# Patient Record
Sex: Male | Born: 1950 | Race: Black or African American | Hispanic: No | Smoking: Never smoker
Health system: Southern US, Community
[De-identification: ages and names within clinical notes are randomized; demographics above are authoritative.]

## PROBLEM LIST (undated history)

## (undated) DIAGNOSIS — N182 Chronic kidney disease, stage 2 (mild): Secondary | ICD-10-CM

## (undated) DIAGNOSIS — I10 Essential (primary) hypertension: Secondary | ICD-10-CM

---

## 2001-06-30 ENCOUNTER — Emergency Department (HOSPITAL_COMMUNITY): Admission: EM | Admit: 2001-06-30 | Discharge: 2001-07-01 | Payer: Self-pay | Admitting: Emergency Medicine

## 2001-07-01 ENCOUNTER — Encounter: Payer: Self-pay | Admitting: Internal Medicine

## 2001-07-06 ENCOUNTER — Encounter: Admission: RE | Admit: 2001-07-06 | Discharge: 2001-07-06 | Payer: Self-pay | Admitting: Internal Medicine

## 2001-07-08 ENCOUNTER — Encounter: Payer: Self-pay | Admitting: Internal Medicine

## 2001-07-08 ENCOUNTER — Ambulatory Visit (HOSPITAL_COMMUNITY): Admission: RE | Admit: 2001-07-08 | Discharge: 2001-07-08 | Payer: Self-pay | Admitting: Internal Medicine

## 2001-08-03 ENCOUNTER — Ambulatory Visit (HOSPITAL_COMMUNITY): Admission: RE | Admit: 2001-08-03 | Discharge: 2001-08-03 | Payer: Self-pay | Admitting: Internal Medicine

## 2001-08-03 ENCOUNTER — Encounter: Payer: Self-pay | Admitting: Internal Medicine

## 2001-09-09 ENCOUNTER — Emergency Department (HOSPITAL_COMMUNITY): Admission: EM | Admit: 2001-09-09 | Discharge: 2001-09-09 | Payer: Self-pay | Admitting: Emergency Medicine

## 2001-09-09 ENCOUNTER — Encounter: Payer: Self-pay | Admitting: Emergency Medicine

## 2001-12-26 ENCOUNTER — Encounter: Payer: Self-pay | Admitting: Emergency Medicine

## 2001-12-26 ENCOUNTER — Emergency Department (HOSPITAL_COMMUNITY): Admission: EM | Admit: 2001-12-26 | Discharge: 2001-12-26 | Payer: Self-pay | Admitting: Emergency Medicine

## 2002-03-31 ENCOUNTER — Encounter: Payer: Self-pay | Admitting: Emergency Medicine

## 2002-04-01 ENCOUNTER — Inpatient Hospital Stay (HOSPITAL_COMMUNITY): Admission: EM | Admit: 2002-04-01 | Discharge: 2002-05-11 | Payer: Self-pay | Admitting: Emergency Medicine

## 2002-04-01 ENCOUNTER — Encounter: Payer: Self-pay | Admitting: Family Medicine

## 2002-04-03 ENCOUNTER — Encounter: Payer: Self-pay | Admitting: Cardiology

## 2002-04-04 ENCOUNTER — Encounter: Payer: Self-pay | Admitting: Family Medicine

## 2002-04-06 ENCOUNTER — Encounter: Payer: Self-pay | Admitting: Family Medicine

## 2002-04-06 ENCOUNTER — Encounter: Payer: Self-pay | Admitting: Internal Medicine

## 2002-04-15 ENCOUNTER — Encounter: Payer: Self-pay | Admitting: Family Medicine

## 2002-04-17 ENCOUNTER — Encounter: Payer: Self-pay | Admitting: Family Medicine

## 2002-04-19 ENCOUNTER — Encounter: Payer: Self-pay | Admitting: Family Medicine

## 2002-04-22 ENCOUNTER — Encounter: Payer: Self-pay | Admitting: Family Medicine

## 2002-04-26 ENCOUNTER — Encounter: Payer: Self-pay | Admitting: Family Medicine

## 2002-04-29 ENCOUNTER — Encounter: Payer: Self-pay | Admitting: Family Medicine

## 2002-05-04 ENCOUNTER — Encounter: Payer: Self-pay | Admitting: Family Medicine

## 2002-05-05 ENCOUNTER — Encounter: Payer: Self-pay | Admitting: Family Medicine

## 2002-05-11 ENCOUNTER — Encounter: Payer: Self-pay | Admitting: Family Medicine

## 2002-05-23 ENCOUNTER — Encounter: Admission: RE | Admit: 2002-05-23 | Discharge: 2002-05-23 | Payer: Self-pay | Admitting: Family Medicine

## 2011-06-25 ENCOUNTER — Inpatient Hospital Stay (HOSPITAL_COMMUNITY)
Admission: EM | Admit: 2011-06-25 | Discharge: 2011-06-27 | DRG: 897 | Disposition: A | Discharge status: Home / Self Care | Payer: Self-pay | Attending: Internal Medicine | Admitting: Internal Medicine

## 2011-06-25 ENCOUNTER — Emergency Department (HOSPITAL_COMMUNITY): Payer: Self-pay

## 2011-06-25 DIAGNOSIS — R55 Syncope and collapse: Secondary | ICD-10-CM | POA: Diagnosis present

## 2011-06-25 DIAGNOSIS — F1010 Alcohol abuse, uncomplicated: Principal | ICD-10-CM | POA: Diagnosis present

## 2011-06-25 DIAGNOSIS — R569 Unspecified convulsions: Secondary | ICD-10-CM | POA: Diagnosis present

## 2011-06-25 DIAGNOSIS — F29 Unspecified psychosis not due to a substance or known physiological condition: Secondary | ICD-10-CM | POA: Diagnosis present

## 2011-06-25 DIAGNOSIS — D649 Anemia, unspecified: Secondary | ICD-10-CM | POA: Diagnosis present

## 2011-06-25 DIAGNOSIS — R030 Elevated blood-pressure reading, without diagnosis of hypertension: Secondary | ICD-10-CM | POA: Diagnosis not present

## 2011-06-25 DIAGNOSIS — E119 Type 2 diabetes mellitus without complications: Secondary | ICD-10-CM | POA: Diagnosis present

## 2011-06-25 LAB — URINALYSIS, ROUTINE W REFLEX MICROSCOPIC
Glucose, UA: 250 mg/dL — AB
Ketones, ur: NEGATIVE mg/dL
Nitrite: NEGATIVE
Specific Gravity, Urine: 1.009 (ref 1.005–1.030)
pH: 5.5 (ref 5.0–8.0)

## 2011-06-25 LAB — COMPREHENSIVE METABOLIC PANEL
ALT: 24 U/L (ref 0–53)
Albumin: 3.2 g/dL — ABNORMAL LOW (ref 3.5–5.2)
Alkaline Phosphatase: 125 U/L — ABNORMAL HIGH (ref 39–117)
Calcium: 8.2 mg/dL — ABNORMAL LOW (ref 8.4–10.5)
GFR calc Af Amer: >60 mL/min (ref >60)
Potassium: 3.5 mEq/L (ref 3.5–5.1)
Sodium: 135 mEq/L (ref 135–145)
Total Protein: 7.2 g/dL (ref 6.0–8.3)

## 2011-06-25 LAB — CK TOTAL AND CKMB (NOT AT ARMC)
CK, MB: 4.9 ng/mL — ABNORMAL HIGH (ref 0.3–4.0)
Total CK: 455 U/L — ABNORMAL HIGH (ref 7–232)

## 2011-06-25 LAB — DIFFERENTIAL
Basophils Absolute: 0 10*3/uL (ref 0.0–0.1)
Basophils Relative: 0 % (ref 0–1)
Eosinophils Absolute: 0.1 10*3/uL (ref 0.0–0.7)
Eosinophils Relative: 2 % (ref 0–5)
Monocytes Absolute: 0.4 10*3/uL (ref 0.1–1.0)
Monocytes Relative: 8 % (ref 3–12)
Neutro Abs: 2.9 10*3/uL (ref 1.7–7.7)

## 2011-06-25 LAB — CARDIAC PANEL(CRET KIN+CKTOT+MB+TROPI)
CK, MB: 4.4 ng/mL — ABNORMAL HIGH (ref 0.3–4.0)
Relative Index: 1.1 (ref 0.0–2.5)
Total CK: 399 U/L — ABNORMAL HIGH (ref 7–232)
Troponin I: <0.3 ng/mL (ref <0.30)

## 2011-06-25 LAB — CBC
MCH: 28.3 pg (ref 26.0–34.0)
MCHC: 32.7 g/dL (ref 30.0–36.0)
Platelets: 183 10*3/uL (ref 150–400)
RDW: 12.4 % (ref 11.5–15.5)

## 2011-06-25 LAB — POCT I-STAT TROPONIN I: Troponin i, poc: 0.01 ng/mL (ref 0.00–0.08)

## 2011-06-25 LAB — POCT I-STAT, CHEM 8
Calcium, Ion: 1.09 mmol/L — ABNORMAL LOW (ref 1.12–1.32)
Chloride: 104 mEq/L (ref 96–112)
HCT: 36 % — ABNORMAL LOW (ref 39.0–52.0)
Hemoglobin: 12.2 g/dL — ABNORMAL LOW (ref 13.0–17.0)
TCO2: 19 mmol/L (ref 0–100)

## 2011-06-25 LAB — RAPID URINE DRUG SCREEN, HOSP PERFORMED
Barbiturates: NOT DETECTED
Benzodiazepines: NOT DETECTED
Cocaine: NOT DETECTED

## 2011-06-25 LAB — PROTIME-INR
INR: 1.13 (ref 0.00–1.49)
Prothrombin Time: 14.7 seconds (ref 11.6–15.2)

## 2011-06-25 LAB — AMMONIA: Ammonia: 30 umol/L (ref 11–60)

## 2011-06-25 LAB — APTT: aPTT: 28 seconds (ref 24–37)

## 2011-06-26 DIAGNOSIS — I509 Heart failure, unspecified: Secondary | ICD-10-CM

## 2011-06-26 DIAGNOSIS — R0989 Other specified symptoms and signs involving the circulatory and respiratory systems: Secondary | ICD-10-CM

## 2011-06-26 LAB — LIPID PANEL
HDL: 29 mg/dL — ABNORMAL LOW (ref >39)
LDL Cholesterol: 64 mg/dL (ref 0–99)
Total CHOL/HDL Ratio: 3.6 RATIO
Triglycerides: 59 mg/dL (ref <150)
VLDL: 12 mg/dL (ref 0–40)

## 2011-06-26 LAB — PRO B NATRIURETIC PEPTIDE: Pro B Natriuretic peptide (BNP): 146.8 pg/mL — ABNORMAL HIGH (ref 0–125)

## 2011-06-26 LAB — CARDIAC PANEL(CRET KIN+CKTOT+MB+TROPI): Troponin I: <0.3 ng/mL (ref <0.30)

## 2011-06-26 LAB — GLUCOSE, CAPILLARY: Glucose-Capillary: 214 mg/dL — ABNORMAL HIGH (ref 70–99)

## 2011-06-26 LAB — HEMOGLOBIN A1C
Hgb A1c MFr Bld: 6.8 % — ABNORMAL HIGH (ref <5.7)
Hgb A1c MFr Bld: 6.8 % — ABNORMAL HIGH (ref <5.7)
Mean Plasma Glucose: 148 mg/dL — ABNORMAL HIGH (ref <117)
Mean Plasma Glucose: 148 mg/dL — ABNORMAL HIGH (ref <117)

## 2011-06-27 ENCOUNTER — Inpatient Hospital Stay (HOSPITAL_COMMUNITY)
Admit: 2011-06-27 | Discharge: 2011-06-27 | Disposition: A | Discharge status: Home / Self Care | Payer: 59 | Attending: Internal Medicine | Admitting: Internal Medicine

## 2011-06-27 LAB — CBC
MCHC: 32.7 g/dL (ref 30.0–36.0)
MCV: 87 fL (ref 78.0–100.0)
Platelets: 202 10*3/uL (ref 150–400)
RDW: 12.5 % (ref 11.5–15.5)
WBC: 4.8 10*3/uL (ref 4.0–10.5)

## 2011-06-27 LAB — COMPREHENSIVE METABOLIC PANEL
AST: 17 U/L (ref 0–37)
Albumin: 2.7 g/dL — ABNORMAL LOW (ref 3.5–5.2)
BUN: 15 mg/dL (ref 6–23)
Chloride: 105 mEq/L (ref 96–112)
Creatinine, Ser: 1.11 mg/dL (ref 0.50–1.35)
Total Bilirubin: 0.3 mg/dL (ref 0.3–1.2)
Total Protein: 6.3 g/dL (ref 6.0–8.3)

## 2011-06-27 LAB — DIFFERENTIAL
Basophils Absolute: 0 10*3/uL (ref 0.0–0.1)
Eosinophils Absolute: 0.1 10*3/uL (ref 0.0–0.7)
Eosinophils Relative: 3 % (ref 0–5)
Lymphs Abs: 1.7 10*3/uL (ref 0.7–4.0)
Monocytes Absolute: 0.6 10*3/uL (ref 0.1–1.0)

## 2011-06-27 LAB — MAGNESIUM: Magnesium: 1.6 mg/dL (ref 1.5–2.5)

## 2011-06-27 NOTE — Procedures (Unsigned)
REFERRING PHYSICIAN:  Dr. Beverly Gust.  HISTORY:  A 60 year old diabetic was found intoxicated and confused by the law and enforcement and brought to the hospital.  EEG for evaluation.  MEDICATIONS:  Lovenox, aspirin, and Ativan.  EEG DURATION:  22.5 minutes of EEG recording.  EEG DESCRIPTION:  This is a routine 16 channel adult EEG recording with one channel devoted to limited EKG recording.  Activation procedure is performed using the photic stimulation.  The study is performed in awake and mild drowsy state. As the EEG opens up, I noticed that the most prominent finding throughout the study is technical artifact affecting the posterior leads as well as the EKG artifact.  Making it difficult to isolate clear location for a posterior dominant rhythm.  However, seems like the background rhythm is in 8 Hz frequency with a low amplitude of 16 Hz at the most.  On this technically limited quality study.  I do not see any electrographic seizures or epileptiform discharges however.  No buildup of high amplitude slowing was noted with the hyperventilation likely because of the lack of efforts.  I do observe some vertex waves, as the patient got drowsy during the study. However, no deep stages of sleep are identified during the study.  Superimposed beta activity is also seen throughout the study pretty much.  EEG INTERPRETATION:  This is a limited quality study technically but does not have any electrographic seizures or epileptiform discharges. Most likely, it is a normal awake and sleep study.  Superimposed beta activities commonly seen in the patients who are anxious or are in sedating medications.  There is no obvious asymmetry or abnormality otherwise detected on the study.          ______________________________ Levie Heritage, MD    [id number] D:  06/27/2011 13:50:39  T:  06/27/2011 23:19:23  Job #:  [id number]

## 2011-07-01 LAB — CULTURE, BLOOD (ROUTINE X 2)
Culture  Setup Time: 201209052357
Culture: NO GROWTH

## 2011-07-08 NOTE — Discharge Summary (Signed)
[name], [name] NO.:  [id number]  MEDICAL RECORD NO.:  [id number]  LOCATION:  1429                         FACILITY:  [facility]  PHYSICIAN:  Marcellus Scott, MD     DATE OF BIRTH:  [birth year]  DATE OF ADMISSION:  06/25/2011 DATE OF DISCHARGE:                        DISCHARGE SUMMARY - REFERRING   PRIMARY CARE PHYSICIAN:  The patient is to be established with [facility].  Followup appointment made this admission.  DATE OF DISPOSITION:  June 27, 2011.  DISCHARGE DIAGNOSES: 1. Acute ethyl alcohol intoxication 2. Syncope with question of seizure at the time of admission. 3. Type 2 diabetes. 4. Anemia, normocytic. 5. Social stressors.  PROCEDURES DURING HOSPITALIZATION: 1. L-spine film obtained June 25, 2011, showed no acute findings. 2. T-spine film obtained June 25, 2011, with no acute findings. 3. Chest x-ray performed June 25, 2011, showing cardiomegaly with     no acute findings. 4. CT of the head performed June 25, 2011, showed no acute     intracranial findings. 5. CT of the C-spine performed June 25, 2011, showed no acute     findings. 6. 2D echocardiogram performed June 26, 2011, showing left     ventricular ejection fraction of 55-60% consistent with mild left     ventricular hypertrophy, no wall motion abnormalities were noted. 7. Carotid Doppler studies performed June 26, 2011, with no     evidence of internal carotid artery stenosis bilaterally. 8. EEG performed June 27, 2011, with no evidence of seizure     activity.  BRIEF HISTORY OF PRESENT ILLNESS:  [name] is a 60 year old African American male with past medical history of 60 year old type 2 diabetes who was found down at [facility] in [facility] by Patent examiner.  Apparently, the patient was found to be intoxicated and confused, prompting transport to the emergency department.  There was some question on arrival whether or not the patient experienced a  syncopal episode.  Witnesses did say that the patient did have some jerking movements of both his upper and lower extremities, also causing some concern for seizure activity.  The patient remained quite confused upon evaluation in the emergency department.  Therefore, complete history was very difficult to obtain. At this point, the patient is back to baseline mentation; however, he does not recall exact details of incident.  He denies chronic EtOH abuse; however, reports acute intoxication.  The patient states he was incarcerated for approximately 15 months and released from prison on June 13, 2011.  Since that time, he has been staying at a shelter in [facility]  COURSE OF HOSPITALIZATION: 1. Acute alcoholic intoxication.  The patient did not show any     evidence of withdrawal during hospitalization.  He was started on     CIWA protocol at the time of admission.  Again, the patient     continued to deny heavy EtOH abuse, but does admit to acute     intoxication.  The patient's alcohol level on initial evaluation     was 293.  The patient has been counseled extensively on the risk of     heavy alcohol abuse.  The patient has verified understanding.  The  patient was administered multivitamin during hospitalization.  He     was asked to continue over-the-counter multivitamin at the time of     discharge. 2. Syncope.  There was a question of seizure activity at the time of     admission.  The patient underwent full workup for both syncope and     seizure.  However, the patient's symptoms felt likely related to     acute alcohol intoxication.  The patient underwent 2D     echocardiogram and carotid studies with results as listed above.     The patient did not have any arrhythmias on telemetry.  During     hospitalization, his heart rate remained stable in the 50s and 60s.     He did drop in the 40s one time, however, the patient was     asymptomatic.  The patient's EKG  did not show any evidence of QT     prolongation or any ST elevation. 3. Type 2 diabetes.  The patient states he was on medication for his     diabetes.  However, he had lost prescriptions and is uncertain of     what medication he was on.  The patient has been counseled on     diabetic diet.  Given that the patient's hemoglobin A1c was 6.9%,     we will start the patient on metformin b.i.d. and have the patient     followup with [facility] as listed below. 4. Anemia, normocytic.  The patient's lab work revealed some mild     normocytic anemia with hemoglobin of 10.  The patient's hemoglobin     on day of disposition 10.1, slight drop likely due to dilutional     component with IV fluids.  We will ask that anemia be worked up as     an outpatient.  Again, followup scheduled as listed below. 5. Social issues.  As mentioned above, the patient recently discharged     from state prison after serving 15 months in it.  Prior to this     hospitalization, he was staying at shelter and heard that space was     unavailable due to hospitalization.  Social worker was asked to     consult.  The patient is given referrals for shelter resources as     well as followup at [facility].  The patient also given     information about ED [facility] for continued medical     care.  DISCHARGE MEDICATIONS: 1. Multivitamin 1 p.o. daily. 2. Metformin 500 mg p.o. b.i.d.  PERTINENT LABORATORY FINDINGS:  White cell count 4.8, platelet count 202, hemoglobin 10.1, and hematocrit 30.9.  Sodium 136, potassium 3.8, BUN 15, and creatinine 1.11.  LFTs within normal limits.  Albumin 2.7 and magnesium 1.6.  Hemoglobin A1c 6.8%.  Total cholesterol 105, triglycerides 59, HDL 29, and LDL 64.  TSH 1.181.  DISPOSITION:  Again, the patient felt medically stable for discharge at this time.  EEG results have been discussed with Dr. Hoy Morn with no evidence of seizure activity.  The patient is  scheduled for followup with Dr. Kathlene Cote at [facility] on July 21, 2011, at 10:30 a.m.  The patient is also instructed on diabetic diet at the time of discharge.  The patient verifies understanding of discharge instructions. He indicates that he will be staying  with his nephew on discharge from hospital.  Approximately 30 minutes spent on discharge planning.     Cordelia Pen,  NP   ______________________________ Marcellus Scott, MD    LE/MEDQ  D:  06/27/2011  T:  06/27/2011  Job:  [id number]  cc:   Andria Frames Fax: [phone]  Electronically Signed by Marcellus Scott MD on 07/08/2011 06:07:04 PM

## 2011-07-28 NOTE — H&P (Signed)
NAMEDARRIK, RICHMAN NO.:  [id number]  MEDICAL RECORD NO.:  [id number]  LOCATION:  WLED                         FACILITY:  [facility]  PHYSICIAN:  Richarda Overlie, MD       DATE OF BIRTH:  [birth year]  DATE OF ADMISSION:  06/25/2011 DATE OF DISCHARGE:                             HISTORY & PHYSICAL   PRIMARY CARE PHYSICIAN:  Unassigned.  CHIEF COMPLAINT:  Syncope and collapse.  SUBJECTIVE:  This is a 60 year old male with a history of diabetes and no other past medical history, who was [place] at the park in [place] by Patent examiner agency when he was found to be intoxicated, confused, and possibly postictal.  The patient's syncopal episode and fall to the ground was unwitnessed, but apparently, the patient had jerking movements of both his upper and lower extremities.  There is no obvious witness at this point for me to verify this information.  However, the patient was apparently confused when the EMS found him.  He is still confused and is unable to provide any meaningful history and most of the history is compiled from the ER notes.  The patient currently denies any chest pain, any shortness of breath, nausea, vomiting, abdominal pain, headache, blurry vision, slurred speech, unilateral weakness, or dysarthria.  There is no prior history of seizures.  The patient denies any fever, chills, rigors, cough, any urinary urgency, frequency, or dysuria.  PAST MEDICAL HISTORY:  History of diabetes.  PAST SURGICAL HISTORY:  Unable to obtain.  FAMILY HISTORY:  Again unable to obtain "the patient states that all his family is deceased."  SOCIAL HISTORY:  The patient cannot recall where he lives and states that he is homeless, but this information cannot be verified at this point.  He smokes a pack a day.  He states that he is a heavy drinker and drinks 80 ounces of beer on a daily basis.  ALLERGIES:  Again unable to stand for his allergies.  HOME MEDICATIONS:  The  patient does not recall what his home medications.  PHYSICAL EXAMINATION:  VITAL SIGNS:  Blood pressure 131/80, pulse of 83, temperature of 97.9, and pulse 83. GENERAL:  Comfortable currently, no acute cardiopulmonary distress, but confused and disoriented to time, place, and person. NECK:  Supple.  No JVD. LUNGS:  Clear to auscultation bilaterally.  No wheezes, no crackles or rhonchi. CARDIOVASCULAR:  Regular rate and rhythm.  No murmurs, rubs, or gallops. ABDOMEN:  Obese, soft, nontender, and nondistended. EXTREMITIES:  Without cyanosis, clubbing, or edema. NEUROLOGIC:  Cranial nerves II through XII appear to be grossly intact without any gross focal neurologic deficits.  Motor strength intact in bilateral upper and lower extremities.  No asterixis. PSYCHIATRIC:  Appropriate mood and affect, but confused and disoriented to time, place, and person. LYMPHATIC:  No axillary, inguinal, or cervical lymphadenopathy.  CT scan of the C-spine shows no acute intracranial abnormality or spondylosis.  No acute findings.  CT scan of the head without contrast shows no acute intracranial abnormality.  Chest x-ray shows cardiomegaly with mild basilar atelectasis.  X-ray of the T-spine shows no acute abnormality.  X-ray of the L-spine shows no acute abnormality.  Urinalysis shows small amount of leukocyte esterase.  Urine drug screen is negative.  INR is 1.13.  Lipase is 5.  EtOH level is 293.  Sodium 135, potassium 3.5, chloride 104, bicarbonate 19, glucose 202, BUN 14, creatinine 1.28, AST 29, ALT 24, calcium 8.2, and alkaline phosphatase 125.  CBC, WBC 4.9, hemoglobin 10.4, hematocrit 31.8, and platelet count of 183.  Troponin 0.01.  ASSESSMENT: 1. Alcohol intoxication. 2. Questionable history of seizure versus syncopal episode secondary     to intoxication because for alcohol. 3. Possible transient ischemic attack in the setting of diabetes. 4. Type 2 diabetes, uncontrolled.  PLAN:  The  patient will be admitted to Telemetry because of a syncopal episode.  No intracranial abnormalities found.  The patient does not have any gross focal neurologic symptoms.  However, being a diabetic, TIA cannot be ruled out.  We will hydrate him with IV fluids.  Monitor him for any signs of alcohol withdrawal.  Check an ammonia level, start him on CIWA protocol.  Check him for cardiovascular risk factors including hemoglobin A1c and fasting lipid profile.  We will maintain him on telemetry and cycle his cardiac enzymes.  We will follow serial EKGs and do a 2-D echo to rule out any wall motion abnormalities.  We will also do a carotid Doppler to rule out stenosis.  However, I feel that the primary problem is not a TIA, but it is alcohol intoxication and syncope in the setting of an alcohol level of 293.  He is a full code.     Richarda Overlie, MD     NA/MEDQ  D:  06/25/2011  T:  06/25/2011  Job:  [id number]  Electronically Signed by Richarda Overlie MD on 07/28/2011 02:00:19 PM

## 2011-10-06 ENCOUNTER — Emergency Department (HOSPITAL_COMMUNITY)
Admission: EM | Admit: 2011-10-06 | Discharge: 2011-10-07 | Disposition: A | Discharge status: Home / Self Care | Payer: Self-pay | Attending: Emergency Medicine | Admitting: Emergency Medicine

## 2011-10-06 ENCOUNTER — Emergency Department (HOSPITAL_COMMUNITY)
Admission: EM | Admit: 2011-10-06 | Discharge: 2011-10-06 | Disposition: A | Discharge status: Home / Self Care | Payer: Self-pay

## 2011-10-06 DIAGNOSIS — R739 Hyperglycemia, unspecified: Secondary | ICD-10-CM

## 2011-10-06 DIAGNOSIS — R079 Chest pain, unspecified: Secondary | ICD-10-CM | POA: Insufficient documentation

## 2011-10-06 DIAGNOSIS — E119 Type 2 diabetes mellitus without complications: Secondary | ICD-10-CM | POA: Insufficient documentation

## 2011-10-06 DIAGNOSIS — R059 Cough, unspecified: Secondary | ICD-10-CM | POA: Insufficient documentation

## 2011-10-06 DIAGNOSIS — F10929 Alcohol use, unspecified with intoxication, unspecified: Secondary | ICD-10-CM

## 2011-10-06 DIAGNOSIS — F1010 Alcohol abuse, uncomplicated: Secondary | ICD-10-CM | POA: Insufficient documentation

## 2011-10-06 DIAGNOSIS — Z794 Long term (current) use of insulin: Secondary | ICD-10-CM | POA: Insufficient documentation

## 2011-10-06 DIAGNOSIS — R05 Cough: Secondary | ICD-10-CM | POA: Insufficient documentation

## 2011-10-06 DIAGNOSIS — R0602 Shortness of breath: Secondary | ICD-10-CM | POA: Insufficient documentation

## 2011-10-07 ENCOUNTER — Encounter: Payer: Self-pay | Admitting: Emergency Medicine

## 2011-10-07 ENCOUNTER — Other Ambulatory Visit: Payer: Self-pay

## 2011-10-07 ENCOUNTER — Emergency Department (HOSPITAL_COMMUNITY): Payer: Self-pay

## 2011-10-07 LAB — URINE MICROSCOPIC-ADD ON

## 2011-10-07 LAB — URINALYSIS, ROUTINE W REFLEX MICROSCOPIC
Bilirubin Urine: NEGATIVE
Hgb urine dipstick: NEGATIVE
Nitrite: NEGATIVE
Protein, ur: NEGATIVE mg/dL
Specific Gravity, Urine: 1.027 (ref 1.005–1.030)
Urobilinogen, UA: 0.2 mg/dL (ref 0.0–1.0)

## 2011-10-07 LAB — COMPREHENSIVE METABOLIC PANEL
AST: 22 U/L (ref 0–37)
BUN: 13 mg/dL (ref 6–23)
CO2: 23 mEq/L (ref 19–32)
Calcium: 9.5 mg/dL (ref 8.4–10.5)
Chloride: 101 mEq/L (ref 96–112)
Creatinine, Ser: 1.07 mg/dL (ref 0.50–1.35)
GFR calc Af Amer: 87 mL/min — ABNORMAL LOW (ref >90)
GFR calc non Af Amer: 75 mL/min — ABNORMAL LOW (ref >90)
Glucose, Bld: 284 mg/dL — ABNORMAL HIGH (ref 70–99)
Total Bilirubin: 0.2 mg/dL — ABNORMAL LOW (ref 0.3–1.2)

## 2011-10-07 LAB — CBC
HCT: 40.5 % (ref 39.0–52.0)
Hemoglobin: 13.6 g/dL (ref 13.0–17.0)
MCV: 87.7 fL (ref 78.0–100.0)
RBC: 4.62 MIL/uL (ref 4.22–5.81)
RDW: 12.5 % (ref 11.5–15.5)
WBC: 6.3 10*3/uL (ref 4.0–10.5)

## 2011-10-07 LAB — DIFFERENTIAL
Basophils Absolute: 0 10*3/uL (ref 0.0–0.1)
Eosinophils Relative: 1 % (ref 0–5)
Lymphocytes Relative: 25 % (ref 12–46)
Lymphs Abs: 1.6 10*3/uL (ref 0.7–4.0)
Monocytes Absolute: 0.4 10*3/uL (ref 0.1–1.0)
Monocytes Relative: 6 % (ref 3–12)
Neutro Abs: 4.3 10*3/uL (ref 1.7–7.7)

## 2011-10-07 LAB — RAPID URINE DRUG SCREEN, HOSP PERFORMED
Amphetamines: NOT DETECTED
Tetrahydrocannabinol: NOT DETECTED

## 2011-10-07 LAB — ETHANOL: Alcohol, Ethyl (B): 175 mg/dL — ABNORMAL HIGH (ref 0–11)

## 2011-10-07 MED ORDER — METFORMIN HCL 500 MG PO TABS: 500.0000 mg | ORAL_TABLET | Freq: Two times a day (BID) | ORAL | Status: AC

## 2011-10-07 MED ORDER — SODIUM CHLORIDE 0.9 % IV BOLUS (SEPSIS)
1000.0000 mL | Freq: Once | INTRAVENOUS | Status: AC
  Administered 2011-10-07: 1000 mL via INTRAVENOUS

## 2011-10-07 MED ORDER — SODIUM CHLORIDE 0.9 % IV BOLUS (SEPSIS)
500.0000 mL | Freq: Once | INTRAVENOUS | Status: AC
  Administered 2011-10-07: 500 mL via INTRAVENOUS

## 2011-10-07 MED ORDER — SODIUM CHLORIDE 0.9 % IV SOLN
INTRAVENOUS | Status: DC
  Administered 2011-10-07: 10:00:00 via INTRAVENOUS

## 2011-10-07 MED ORDER — HEPATITIS B VAC RECOMBINANT 10 MCG/0.5ML IJ SUSP: 0.5000 mL | Freq: Once | INTRAMUSCULAR | Status: DC

## 2011-10-07 NOTE — ED Notes (Signed)
Vital signs stable. 

## 2011-10-07 NOTE — ED Notes (Signed)
Patient is resting comfortably. 

## 2011-10-07 NOTE — ED Notes (Signed)
Pt. Is not able to use the restroom at this time.  

## 2011-10-07 NOTE — ED Notes (Signed)
Patient denies pain and is resting comfortably.  

## 2011-10-07 NOTE — ED Notes (Addendum)
Pt st's he's had a cough for about 2 weeks.  Pt denies n/v, st's he had some diarrhea yesterday.  Complains of chest pain (only when he coughs) and denies abdominal pain, no SOB.  Lung sounds clear and equal bilaterally.  No fevers.

## 2011-10-07 NOTE — ED Notes (Signed)
Pt states he is here for a cough  Pt states it has been going on for the past two weeks and he wants it checked out

## 2011-10-07 NOTE — ED Notes (Signed)
Pt DC'd to home.  Alert and oriented.  Denies pain.  Cough present however prescription medication provided.  Pt given bus pass to assist with discharge home.  Pt has all belongings.  Vitals stable.  Discharge instructions provided.  Pt verbalized understanding.  No other concerns at this time.  Barrie Lyme 11:31 AM 10/07/2011

## 2011-10-07 NOTE — ED Provider Notes (Addendum)
History     CSN: [id number] Arrival date & time: 10/06/2011 11:22 PM   First MD Initiated Contact with Patient 10/07/11 [phone]      Chief Complaint  Patient presents with  . Cough    (Consider location/radiation/quality/duration/timing/severity/associated sxs/prior treatment) Patient is a 60 y.o. male presenting with cough. The history is provided by the patient.  Cough This is a new problem. The current episode started more than 2 days ago. The problem occurs hourly. The problem has not changed since onset.The cough is non-productive. There has been no fever. Associated symptoms include chest pain (Only with cough) and shortness of breath. Pertinent negatives include no chills, no sweats, no headaches, no myalgias and no wheezing. He has tried nothing for the symptoms. He is not a smoker.   patient states he walks about 5 miles to get here. During the walk. He had to stop several times because he felt tired. He states he stopped his insulin about a week ago because he ran out. He was given to him by Dr. when he was admitted to this hospital recently. He has no primary care doctor.  Past Medical History  Diagnosis Date  . Diabetes mellitus     History reviewed. No pertinent past surgical history.  History reviewed. No pertinent family history.  History  Substance Use Topics  . Smoking status: Never Smoker   . Smokeless tobacco: Not on file  . Alcohol Use: Yes     heavy      Review of Systems  Constitutional: Negative for chills.  Respiratory: Positive for cough and shortness of breath. Negative for wheezing.   Cardiovascular: Positive for chest pain (Only with cough).  Musculoskeletal: Negative for myalgias.  Neurological: Negative for headaches.  All other systems reviewed and are negative.    Allergies  Review of patient's allergies indicates no known allergies.  Home Medications  No current outpatient prescriptions on file.  BP 147/82  Pulse 58  Temp(Src)  97.8 F (36.6 C) (Oral)  Resp 16  SpO2 100%  Physical Exam  Constitutional: He is oriented to person, place, and time. He appears well-developed and well-nourished.  HENT:  Head: Normocephalic and atraumatic.  Eyes: Pupils are equal, round, and reactive to light.  Neck: Normal range of motion. Neck supple.  Cardiovascular: Normal rate and regular rhythm.   Pulmonary/Chest: Effort normal and breath sounds normal.  Abdominal: Soft. Bowel sounds are normal.  Musculoskeletal: Normal range of motion. He exhibits no edema and no tenderness.  Neurological: He is alert and oriented to person, place, and time. No cranial nerve deficit. He exhibits normal muscle tone. Coordination normal.  Skin: Skin is warm and dry.  Psychiatric: He has a normal mood and affect. His behavior is normal. Judgment and thought content normal.    ED Course  Procedures (including critical care time)  Labs Reviewed  COMPREHENSIVE METABOLIC PANEL - Abnormal; Notable for the following:    Glucose, Bld 284 (*)    Total Protein 9.1 (*)    Alkaline Phosphatase 161 (*)    Total Bilirubin 0.2 (*)    GFR calc non Af Amer 75 (*)    GFR calc Af Amer 87 (*)    All other components within normal limits  URINALYSIS, ROUTINE W REFLEX MICROSCOPIC - Abnormal; Notable for the following:    Glucose, UA >1000 (*)    Ketones, ur TRACE (*)    All other components within normal limits  ETHANOL - Abnormal; Notable for the following:  Alcohol, Ethyl (B) 175 (*)    All other components within normal limits  URINE MICROSCOPIC-ADD ON - Abnormal; Notable for the following:    Bacteria, UA FEW (*)    Casts HYALINE CASTS (*) GRANULAR CAST   All other components within normal limits  CBC  DIFFERENTIAL  URINE RAPID DRUG SCREEN (HOSP PERFORMED)   Dg Chest 2 View  10/07/2011  *RADIOLOGY REPORT*  Clinical Data: Cough, mid chest pain.  CHEST - 2 VIEW  Comparison: 06/25/2011  Findings: Heart size upper normal limits.  No focal  consolidation. No pleural effusion or pneumothorax.  No acute osseous abnormality.  IMPRESSION: No acute process identified.  Original Report Authenticated By: Waneta Martins, M.D.   09:27- care to Dr. Preston Fleeting.  No diagnosis found.   Date: 10/07/2011  Rate: 69  Rhythm: normal sinus rhythm  QRS Axis: normal  Intervals: normal  ST/T Wave abnormalities: normal  Conduction Disutrbances:none  Narrative Interpretation: LVH  Old EKG Reviewed: unchanged   MDM  Hypoglycemia, and alcohol intoxication. Is not clear what medications the patient takes. He is too intoxicated to leave at this time. He came here by himself, walking.       Flint Melter, MD 10/07/11 [id number]  Flint Melter, MD 10/07/11 1191  0945: Care assumed from Dr. Sherryl Manges. Patient with alcohol intoxication and history of medication noncompliance. Attempts are being made at this time to do a medication reconciliation so that he can be given new prescriptions for his medications, and he will be observed in the emergency department until he has achieved a level of sobriety which did make him safe to discharge.  10:45: Patient reevaluated. He is awake, alert, oriented and coherent. I reviewed his old records and he was discharged from the hospital on metformin 500 mg twice a day. The patient states his medications have not changed since then. You have be given a prescription for metformin and he is urged to follow up with his physician at [facility] in the next week.  Dione Booze, MD 10/07/11 1044

## 2013-06-22 IMAGING — CR DG THORACIC SPINE 2V
3 series · 3 of 3 positions shown · non-contrast
Comparison: None

CLINICAL DATA: Recent seizure with fall, mid to lower back pain

THORACIC SPINE - 2 VIEW

[t thoracic spine ap]
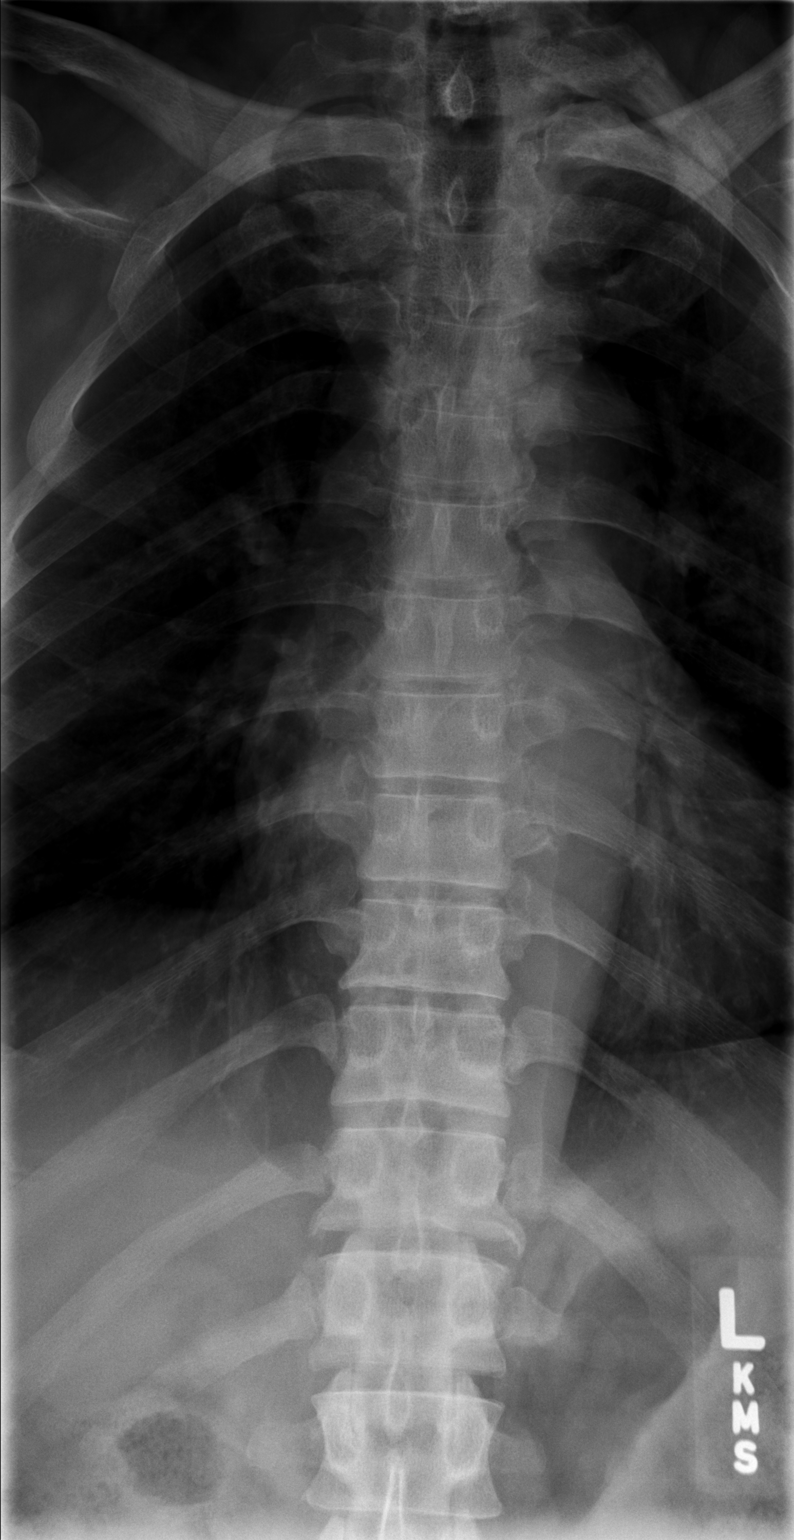

[t thoracic spine lat]
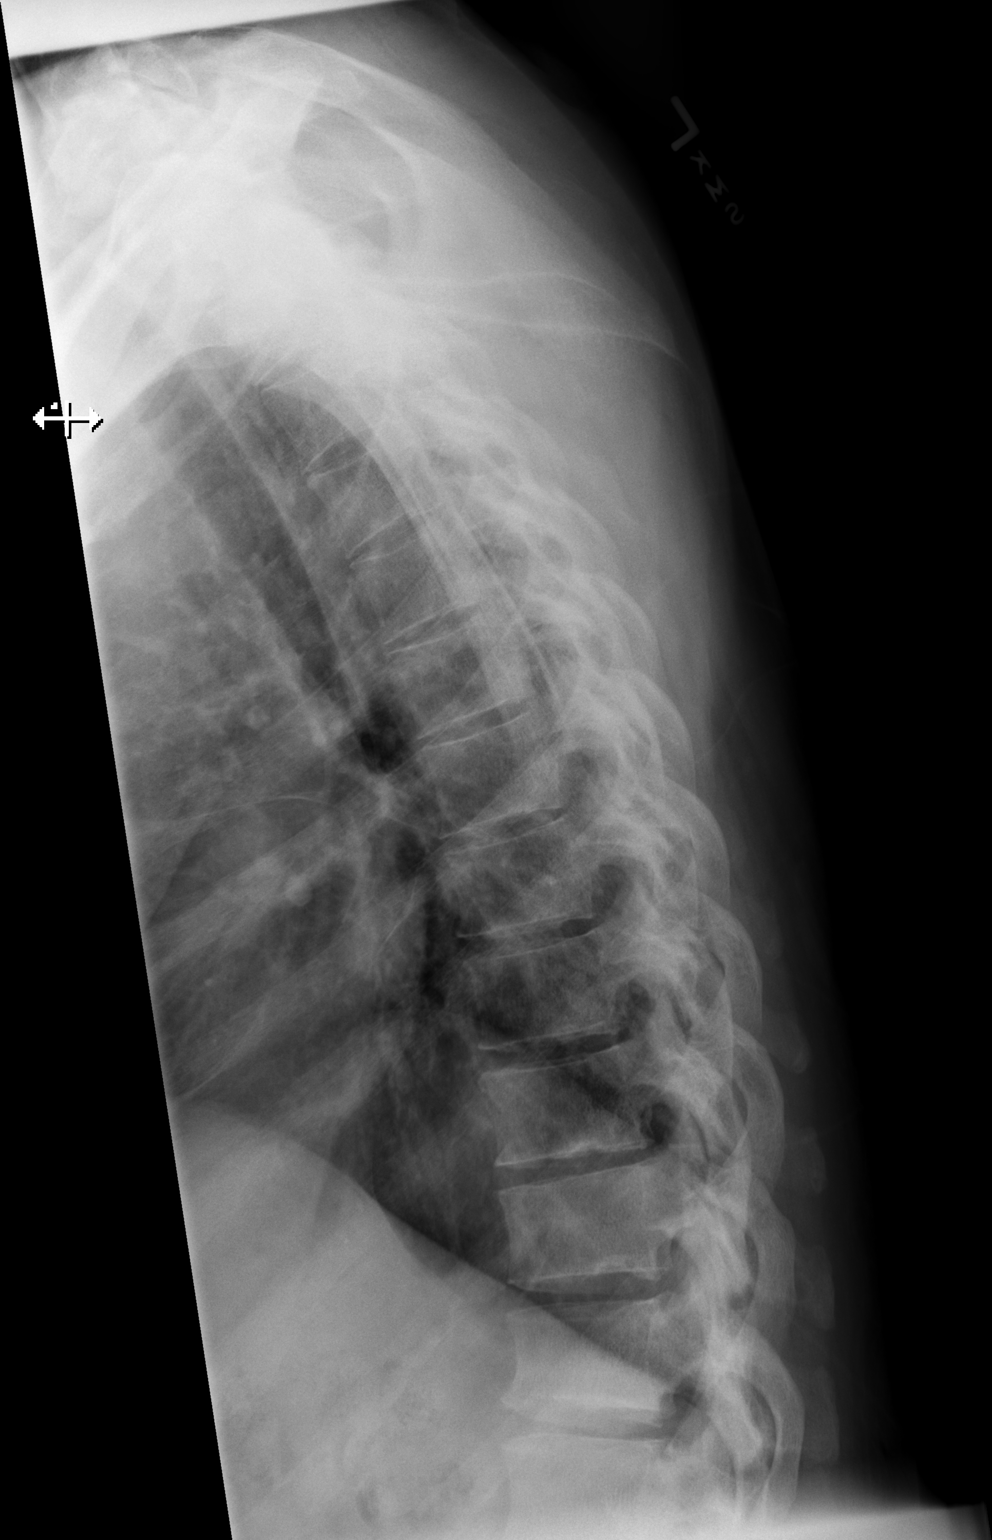

[t thoracic swimmers]
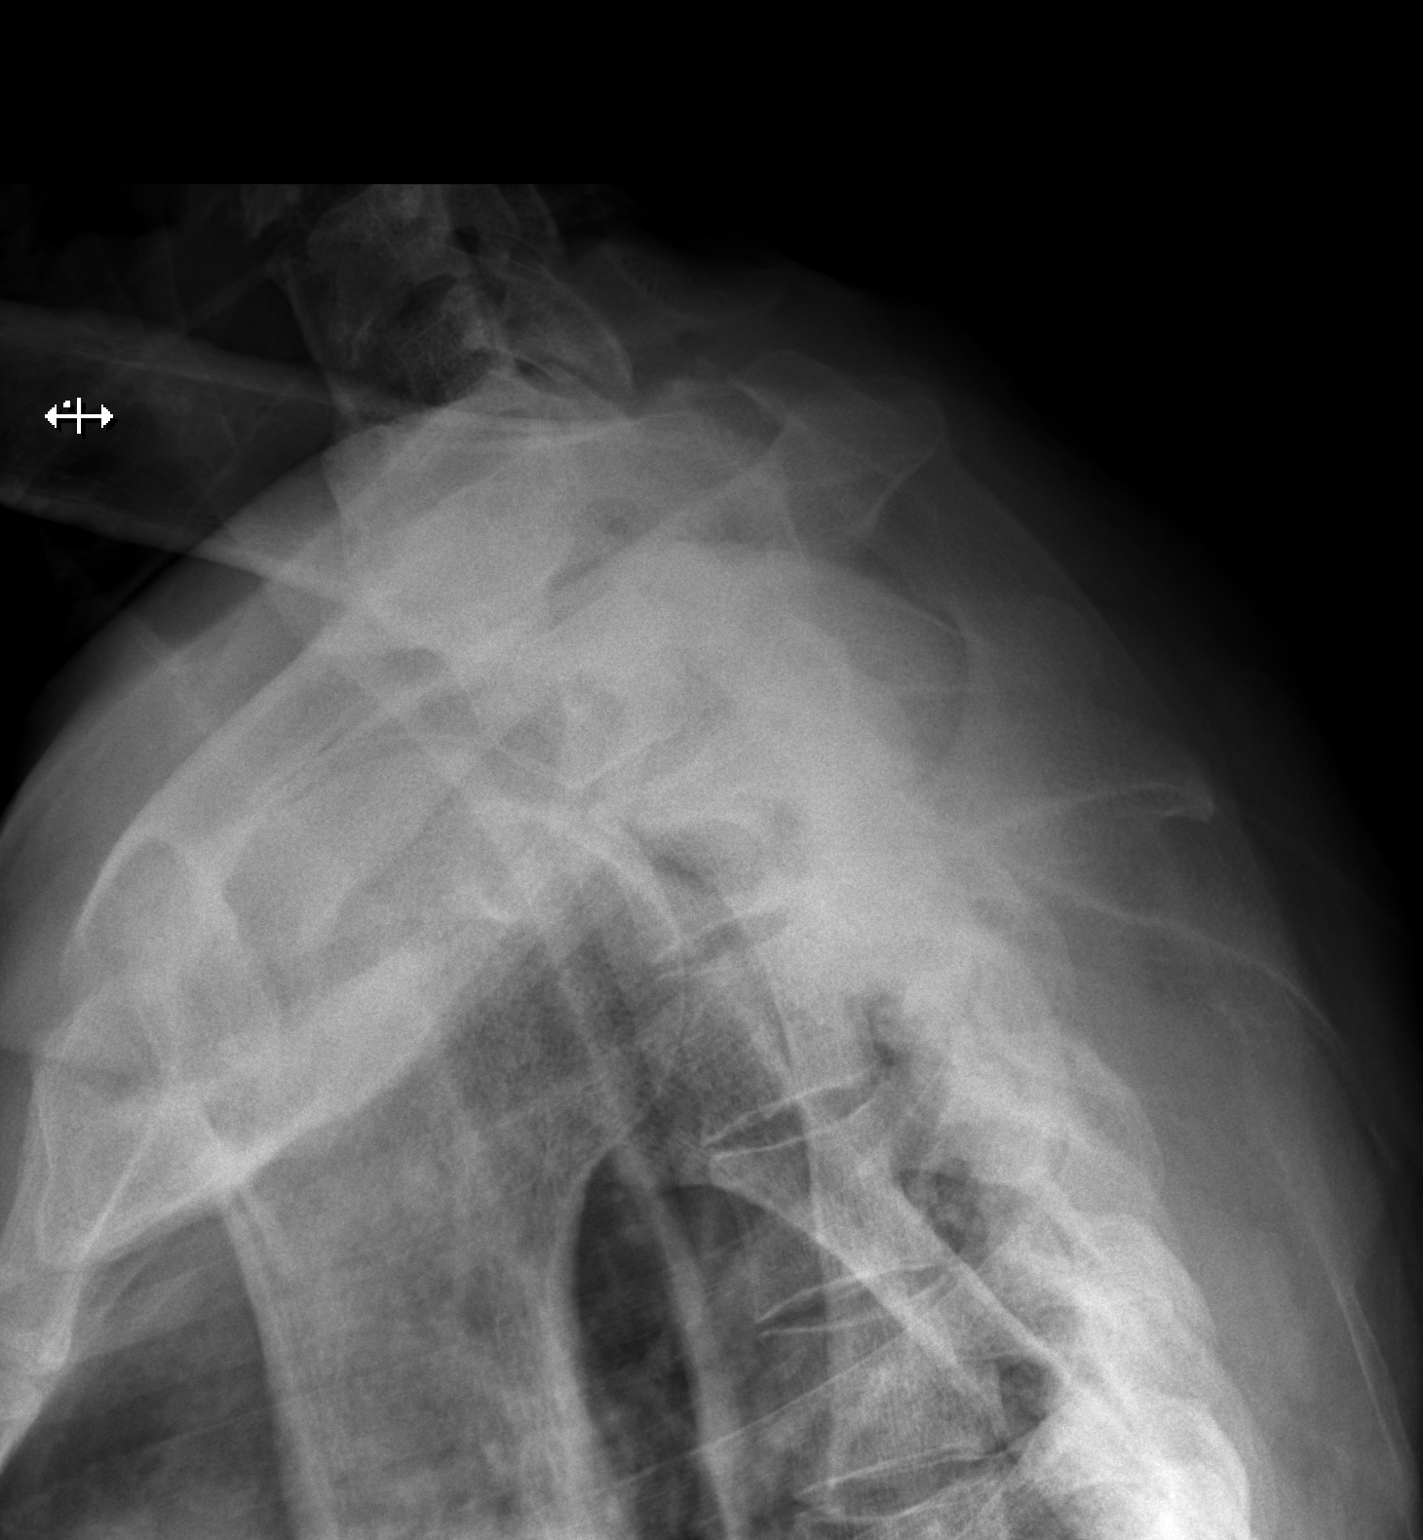

[3 of 3 positions shown; findings below may reference images not displayed]

FINDINGS: The thoracic vertebrae are in normal alignment.  No
compression deformity is seen.  Minimal degenerative change is
present in the lower thoracic spine.
IMPRESSION: No acute abnormality.

## 2017-04-12 ENCOUNTER — Encounter (HOSPITAL_COMMUNITY): Payer: Self-pay | Admitting: Emergency Medicine

## 2017-04-12 ENCOUNTER — Emergency Department (HOSPITAL_COMMUNITY)
Admission: EM | Admit: 2017-04-12 | Discharge: 2017-04-12 | Disposition: A | Discharge status: Home / Self Care | Payer: Self-pay | Attending: Emergency Medicine | Admitting: Emergency Medicine

## 2017-04-12 DIAGNOSIS — M25571 Pain in right ankle and joints of right foot: Secondary | ICD-10-CM

## 2017-04-12 DIAGNOSIS — M25572 Pain in left ankle and joints of left foot: Secondary | ICD-10-CM

## 2017-04-12 DIAGNOSIS — Z7984 Long term (current) use of oral hypoglycemic drugs: Secondary | ICD-10-CM | POA: Insufficient documentation

## 2017-04-12 DIAGNOSIS — E119 Type 2 diabetes mellitus without complications: Secondary | ICD-10-CM | POA: Insufficient documentation

## 2017-04-12 DIAGNOSIS — M79671 Pain in right foot: Secondary | ICD-10-CM | POA: Insufficient documentation

## 2017-04-12 DIAGNOSIS — M79672 Pain in left foot: Secondary | ICD-10-CM | POA: Insufficient documentation

## 2017-04-12 MED ORDER — GABAPENTIN 100 MG PO CAPS: 100.0000 mg | ORAL_CAPSULE | Freq: Three times a day (TID) | ORAL | 0 refills | Status: DC

## 2017-04-12 MED ORDER — ACETAMINOPHEN 325 MG PO TABS
650.0000 mg | ORAL_TABLET | Freq: Once | ORAL | Status: AC
  Administered 2017-04-12: 650 mg via ORAL
  Filled 2017-04-12: qty 2

## 2017-04-12 MED ORDER — ACETAMINOPHEN 500 MG PO TABS
500.0000 mg | ORAL_TABLET | Freq: Four times a day (QID) | ORAL | 0 refills | Status: AC | PRN
Start: 2017-04-12 — End: ?

## 2017-04-12 MED ORDER — GABAPENTIN 100 MG PO CAPS
100.0000 mg | ORAL_CAPSULE | Freq: Once | ORAL | Status: AC
  Administered 2017-04-12: 100 mg via ORAL
  Filled 2017-04-12: qty 1
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
## 2017-04-16 ENCOUNTER — Inpatient Hospital Stay (HOSPITAL_COMMUNITY)
Admission: EM | Admit: 2017-04-16 | Discharge: 2017-04-18 | DRG: 638 | Disposition: A | Discharge status: Home / Self Care | Payer: Self-pay | Attending: Internal Medicine | Admitting: Internal Medicine

## 2017-04-16 ENCOUNTER — Encounter (HOSPITAL_COMMUNITY): Payer: Self-pay

## 2017-04-16 DIAGNOSIS — M79605 Pain in left leg: Secondary | ICD-10-CM

## 2017-04-16 DIAGNOSIS — N179 Acute kidney failure, unspecified: Secondary | ICD-10-CM | POA: Diagnosis present

## 2017-04-16 DIAGNOSIS — E860 Dehydration: Secondary | ICD-10-CM | POA: Diagnosis present

## 2017-04-16 DIAGNOSIS — E1142 Type 2 diabetes mellitus with diabetic polyneuropathy: Secondary | ICD-10-CM | POA: Diagnosis present

## 2017-04-16 DIAGNOSIS — E1110 Type 2 diabetes mellitus with ketoacidosis without coma: Principal | ICD-10-CM | POA: Diagnosis present

## 2017-04-16 DIAGNOSIS — E1122 Type 2 diabetes mellitus with diabetic chronic kidney disease: Secondary | ICD-10-CM | POA: Diagnosis present

## 2017-04-16 DIAGNOSIS — Z5900 Homelessness unspecified: Secondary | ICD-10-CM

## 2017-04-16 DIAGNOSIS — M79604 Pain in right leg: Secondary | ICD-10-CM | POA: Diagnosis present

## 2017-04-16 DIAGNOSIS — N182 Chronic kidney disease, stage 2 (mild): Secondary | ICD-10-CM | POA: Diagnosis present

## 2017-04-16 DIAGNOSIS — I129 Hypertensive chronic kidney disease with stage 1 through stage 4 chronic kidney disease, or unspecified chronic kidney disease: Secondary | ICD-10-CM | POA: Diagnosis present

## 2017-04-16 DIAGNOSIS — E875 Hyperkalemia: Secondary | ICD-10-CM | POA: Diagnosis present

## 2017-04-16 DIAGNOSIS — T383X6A Underdosing of insulin and oral hypoglycemic [antidiabetic] drugs, initial encounter: Secondary | ICD-10-CM | POA: Diagnosis present

## 2017-04-16 DIAGNOSIS — I10 Essential (primary) hypertension: Secondary | ICD-10-CM | POA: Diagnosis present

## 2017-04-16 DIAGNOSIS — Z9114 Patient's other noncompliance with medication regimen: Secondary | ICD-10-CM

## 2017-04-16 HISTORY — DX: Chronic kidney disease, stage 2 (mild): N18.2

## 2017-04-16 HISTORY — DX: Essential (primary) hypertension: I10

## 2017-04-16 LAB — CBC WITH DIFFERENTIAL/PLATELET
BASOS ABS: 0 10*3/uL (ref 0.0–0.1)
Basophils Relative: 0 %
Eosinophils Absolute: 0 10*3/uL (ref 0.0–0.7)
Eosinophils Relative: 0 %
HCT: 35.5 % — ABNORMAL LOW (ref 39.0–52.0)
Hemoglobin: 11.6 g/dL — ABNORMAL LOW (ref 13.0–17.0)
LYMPHS ABS: 1.4 10*3/uL (ref 0.7–4.0)
LYMPHS PCT: 18 %
MCH: 28 pg (ref 26.0–34.0)
MCHC: 32.7 g/dL (ref 30.0–36.0)
MCV: 85.5 fL (ref 78.0–100.0)
Monocytes Absolute: 0.6 10*3/uL (ref 0.1–1.0)
Monocytes Relative: 8 %
NEUTROS PCT: 74 %
Neutro Abs: 5.9 10*3/uL (ref 1.7–7.7)
Platelets: 322 10*3/uL (ref 150–400)
RBC: 4.15 MIL/uL — AB (ref 4.22–5.81)
RDW: 13 % (ref 11.5–15.5)
WBC: 7.9 10*3/uL (ref 4.0–10.5)

## 2017-04-16 LAB — COMPREHENSIVE METABOLIC PANEL
ALK PHOS: 87 U/L (ref 38–126)
ALT: 24 U/L (ref 17–63)
AST: 18 U/L (ref 15–41)
Albumin: 3.7 g/dL (ref 3.5–5.0)
Anion gap: 17 — ABNORMAL HIGH (ref 5–15)
BUN: 85 mg/dL — AB (ref 6–20)
CALCIUM: 9.7 mg/dL (ref 8.9–10.3)
CO2: 18 mmol/L — AB (ref 22–32)
Chloride: 93 mmol/L — ABNORMAL LOW (ref 101–111)
Creatinine, Ser: 2.81 mg/dL — ABNORMAL HIGH (ref 0.61–1.24)
GFR, EST AFRICAN AMERICAN: 26 mL/min — AB (ref >60)
GFR, EST NON AFRICAN AMERICAN: 22 mL/min — AB (ref >60)
Glucose, Bld: 491 mg/dL — ABNORMAL HIGH (ref 65–99)
Potassium: 5.3 mmol/L — ABNORMAL HIGH (ref 3.5–5.1)
Sodium: 128 mmol/L — ABNORMAL LOW (ref 135–145)
Total Bilirubin: 1 mg/dL (ref 0.3–1.2)
Total Protein: 8.9 g/dL — ABNORMAL HIGH (ref 6.5–8.1)

## 2017-04-16 LAB — CBG MONITORING, ED: Glucose-Capillary: 530 mg/dL (ref 65–99)

## 2017-04-16 MED ORDER — METFORMIN HCL 500 MG PO TABS: 500.0000 mg | ORAL_TABLET | Freq: Two times a day (BID) | ORAL | 0 refills | Status: DC

## 2017-04-16 MED ORDER — GABAPENTIN 100 MG PO CAPS: 100.0000 mg | ORAL_CAPSULE | Freq: Three times a day (TID) | ORAL | 0 refills | Status: DC

## 2017-04-16 MED ORDER — SODIUM CHLORIDE 0.9 % IV BOLUS (SEPSIS)
1000.0000 mL | Freq: Once | INTRAVENOUS | Status: AC
  Administered 2017-04-16: 1000 mL via INTRAVENOUS

## 2017-04-16 MED ORDER — NAPROXEN 250 MG PO TABS
500.0000 mg | ORAL_TABLET | Freq: Once | ORAL | Status: AC
  Administered 2017-04-16: 500 mg via ORAL
  Filled 2017-04-16: qty 2

## 2017-04-16 MED ORDER — ACETAMINOPHEN 325 MG PO TABS: 650.0000 mg | ORAL_TABLET | Freq: Four times a day (QID) | ORAL | 0 refills | Status: DC | PRN

## 2017-04-16 NOTE — ED Provider Notes (Signed)
[facility] Provider Note   CSN: [id number] Arrival date & time: 04/16/17  2104     History   Chief Complaint Chief Complaint  Patient presents with  . Leg Pain  . Hyperglycemia    HPI [name] is a 66 y.o. male.  HPI 66 year old male who presents with bilateral foot pain. History of diabetes and hypertension. Was seen in the emergency department 4 days ago for bilateral foot pain, related to his arthritis. He states that he was given Tylenol and gabapentin for pain, which improved his symptoms. [facility], but was told that it wouldn't be for another one or 2 weeks until they were able to fill his prescriptions. Been on his feet quite a bit walking as he is homeless, and pain is worse with ambulation. No foot swelling, redness, fevers or chills. No fall or injury. States that the homeless shelter did not have room for him tonight. Has also been missing his other medications including his diabetes medications. No nausea, vomiting, abdominal pain, chest pain or difficulty breathing.   Past Medical History:  Diagnosis Date  . Diabetes mellitus     There are no active problems to display for this patient.   History reviewed. No pertinent surgical history.     Home Medications    Prior to Admission medications   Medication Sig Start Date End Date Taking? Authorizing Provider  acetaminophen (TYLENOL) 325 MG tablet Take 2 tablets (650 mg total) by mouth every 6 (six) hours as needed. 04/16/17   Lavera GuiseLiu, Alfred Harrel Duo, MD  acetaminophen (TYLENOL) 500 MG tablet Take 1 tablet (500 mg total) by mouth every 6 (six) hours as needed. Patient not taking: Reported on 04/16/2017 04/12/17   Fayrene Helperran, Bowie, PA-C  gabapentin (NEURONTIN) 100 MG capsule Take 1 capsule (100 mg total) by mouth 3 (three) times daily. Patient not taking: Reported on 04/16/2017 04/12/17   Fayrene Helperran, Bowie, PA-C  gabapentin (NEURONTIN) 100 MG capsule Take 1 capsule (100 mg total) by mouth 3  (three) times daily. 04/16/17   Lavera GuiseLiu, Nesiah Jump Duo, MD  metFORMIN (GLUCOPHAGE) 500 MG tablet Take 1 tablet (500 mg total) by mouth 2 (two) times daily with a meal. Patient not taking: Reported on 04/16/2017 10/07/11 10/06/12  Dione BoozeGlick, David, MD  metFORMIN (GLUCOPHAGE) 500 MG tablet Take 1 tablet (500 mg total) by mouth 2 (two) times daily with a meal. 04/16/17   Lavera GuiseLiu, Toshi Ishii Duo, MD    Family History History reviewed. No pertinent family history.  Social History Social History  Substance Use Topics  . Smoking status: Never Smoker  . Smokeless tobacco: Never Used  . Alcohol use No     Comment: heavy     Allergies   Patient has no known allergies.   Review of Systems Review of Systems  Constitutional: Negative for fever.  Respiratory: Negative for cough.   Cardiovascular: Negative for chest pain.  Gastrointestinal: Negative for abdominal pain, nausea and vomiting.  Musculoskeletal: Positive for arthralgias.  Skin: Negative for wound.  Neurological: Negative for weakness and numbness.  All other systems reviewed and are negative.    Physical Exam Updated Vital Signs BP (!) 150/63   Pulse 78   Temp 98.8 F (37.1 C)   Resp (!) 23   Ht 6\' 1"  (1.854 m)   Wt 103.4 kg (228 lb)   SpO2 100%   BMI 30.08 kg/m   Physical Exam Physical Exam  Nursing note and vitals reviewed. Constitutional: Well developed, well  nourished, non-toxic, and in no acute distress Head: Normocephalic and atraumatic.  Mouth/Throat: Oropharynx is clear and moist.  Neck: Normal range of motion. Neck supple.  Cardiovascular: Normal rate and regular rhythm.   Pulmonary/Chest: Effort normal and breath sounds normal.  Abdominal: Soft. There is no tenderness. There is no rebound and no guarding.  Musculoskeletal: Normal range of motion of bilateral lower extremities. No overlying skin changes, soft tissue swelling, deformities or bruising.  Neurological: Alert, no facial droop, fluent speech, moves all extremities  symmetrically Skin: Skin is warm and dry.  Psychiatric: Cooperative   ED Treatments / Results  Labs (all labs ordered are listed, but only abnormal results are displayed) Labs Reviewed  CBC WITH DIFFERENTIAL/PLATELET - Abnormal; Notable for the following:       Result Value   RBC 4.15 (*)    Hemoglobin 11.6 (*)    HCT 35.5 (*)    All other components within normal limits  COMPREHENSIVE METABOLIC PANEL - Abnormal; Notable for the following:    Sodium 128 (*)    Potassium 5.3 (*)    Chloride 93 (*)    CO2 18 (*)    Glucose, Bld 491 (*)    BUN 85 (*)    Creatinine, Ser 2.81 (*)    Total Protein 8.9 (*)    GFR calc non Af Amer 22 (*)    GFR calc Af Amer 26 (*)    Anion gap 17 (*)    All other components within normal limits  URINALYSIS, ROUTINE W REFLEX MICROSCOPIC - Abnormal; Notable for the following:    Color, Urine STRAW (*)    Glucose, UA >=500 (*)    Hgb urine dipstick MODERATE (*)    Ketones, ur 20 (*)    All other components within normal limits  CBG MONITORING, ED - Abnormal; Notable for the following:    Glucose-Capillary 530 (*)    All other components within normal limits  BASIC METABOLIC PANEL  BASIC METABOLIC PANEL    EKG  EKG Interpretation None       Radiology No results found.  Procedures Procedures (including critical care time) CRITICAL CARE Performed by: Lavera Guise   Total critical care time: 35 minutes  Critical care time was exclusive of separately billable procedures and treating other patients.  Critical care was necessary to treat or prevent imminent or life-threatening deterioration.  Critical care was time spent personally by me on the following activities: development of treatment plan with patient and/or surrogate as well as nursing, discussions with consultants, evaluation of patient's response to treatment, examination of patient, obtaining history from patient or surrogate, ordering and performing treatments and  interventions, ordering and review of laboratory studies, ordering and review of radiographic studies, pulse oximetry and re-evaluation of patient's condition.  Medications Ordered in ED Medications  sodium chloride 0.9 % bolus 1,000 mL (not administered)  0.9 %  sodium chloride infusion (not administered)  0.9 %  sodium chloride infusion (not administered)  insulin regular (NOVOLIN R,HUMULIN R) 100 Units in sodium chloride 0.9 % 100 mL (1 Units/mL) infusion (not administered)  dextrose 5 %-0.45 % sodium chloride infusion (not administered)  naproxen (NAPROSYN) tablet 500 mg (500 mg Oral Given 04/16/17 2155)  sodium chloride 0.9 % bolus 1,000 mL (1,000 mLs Intravenous New Bag/Given 04/16/17 2314)     Initial Impression / Assessment and Plan / ED Course  I have reviewed the triage vital signs and the nursing notes.  Pertinent labs & imaging results  that were available during my care of the patient were reviewed by me and considered in my medical decision making (see chart for details).    With bilateral foot pain that seem c/w arthritis, just not having medications to take for pain. No evidence of infection, joint swelling, injuries. Did give naproxen for pain, to good relief. However, only after did blood work return and he has AKI. Will hold any further NSAIDs.   Blood work is concerning for hyperglycemia with mild DKA. Bicarbonate of 18 and anion gap of 17. Given IVF and started on insulin gtt. This is in setting of not having his medications due to homelessness. Will plan to admit for management of mild DKA.    Final Clinical Impressions(s) / ED Diagnoses   Final diagnoses:  Diabetic ketoacidosis without coma associated with type 2 diabetes mellitus (HCC)    New Prescriptions New Prescriptions   ACETAMINOPHEN (TYLENOL) 325 MG TABLET    Take 2 tablets (650 mg total) by mouth every 6 (six) hours as needed.   GABAPENTIN (NEURONTIN) 100 MG CAPSULE    Take 1 capsule (100 mg total) by  mouth 3 (three) times daily.   METFORMIN (GLUCOPHAGE) 500 MG TABLET    Take 1 tablet (500 mg total) by mouth 2 (two) times daily with a meal.     Lavera Guise, MD 04/17/17 [phone]

## 2017-04-16 NOTE — ED Notes (Signed)
Attempted an IV x2 unsuccessful
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
## 2017-04-16 NOTE — Care Management (Signed)
ED CM received consult concerning patient needing medication assistance. [facility]. Patient is homeless and does not have the funds. CM will attempt to have copays waived tomorrow. Patient reports using [facility] [address]. CM printed and provided patient with  MATCH letter.

## 2017-04-16 NOTE — ED Notes (Addendum)
Pt has some redness on the top of the left and right foot a little tender to the touch. Pt states the pain has been going on for two weeks. Pt states he has not hit it or have anything fall on it.

## 2017-04-17 ENCOUNTER — Encounter (HOSPITAL_COMMUNITY): Payer: Self-pay | Admitting: Internal Medicine

## 2017-04-17 DIAGNOSIS — E875 Hyperkalemia: Secondary | ICD-10-CM | POA: Diagnosis present

## 2017-04-17 DIAGNOSIS — E1010 Type 1 diabetes mellitus with ketoacidosis without coma: Secondary | ICD-10-CM

## 2017-04-17 DIAGNOSIS — E1110 Type 2 diabetes mellitus with ketoacidosis without coma: Secondary | ICD-10-CM | POA: Diagnosis present

## 2017-04-17 DIAGNOSIS — E1142 Type 2 diabetes mellitus with diabetic polyneuropathy: Secondary | ICD-10-CM

## 2017-04-17 DIAGNOSIS — N179 Acute kidney failure, unspecified: Secondary | ICD-10-CM | POA: Diagnosis present

## 2017-04-17 DIAGNOSIS — M79604 Pain in right leg: Secondary | ICD-10-CM

## 2017-04-17 DIAGNOSIS — Z5900 Homelessness unspecified: Secondary | ICD-10-CM

## 2017-04-17 DIAGNOSIS — N182 Chronic kidney disease, stage 2 (mild): Secondary | ICD-10-CM

## 2017-04-17 DIAGNOSIS — M79605 Pain in left leg: Secondary | ICD-10-CM | POA: Diagnosis present

## 2017-04-17 DIAGNOSIS — I10 Essential (primary) hypertension: Secondary | ICD-10-CM | POA: Diagnosis present

## 2017-04-17 LAB — BASIC METABOLIC PANEL
ANION GAP: 13 (ref 5–15)
ANION GAP: 8 (ref 5–15)
ANION GAP: 8 (ref 5–15)
ANION GAP: 10 (ref 5–15)
Anion gap: 12 (ref 5–15)
BUN: 81 mg/dL — ABNORMAL HIGH (ref 6–20)
BUN: 80 mg/dL — ABNORMAL HIGH (ref 6–20)
BUN: 72 mg/dL — ABNORMAL HIGH (ref 6–20)
BUN: 65 mg/dL — ABNORMAL HIGH (ref 6–20)
BUN: 55 mg/dL — ABNORMAL HIGH (ref 6–20)
CALCIUM: 8.9 mg/dL (ref 8.9–10.3)
CALCIUM: 9 mg/dL (ref 8.9–10.3)
CALCIUM: 9 mg/dL (ref 8.9–10.3)
CHLORIDE: 100 mmol/L — AB (ref 101–111)
CHLORIDE: 101 mmol/L (ref 101–111)
CO2: 17 mmol/L — ABNORMAL LOW (ref 22–32)
CO2: 18 mmol/L — AB (ref 22–32)
CO2: 22 mmol/L (ref 22–32)
CO2: 21 mmol/L — AB (ref 22–32)
CO2: 19 mmol/L — AB (ref 22–32)
Calcium: 8.9 mg/dL (ref 8.9–10.3)
Calcium: 8.9 mg/dL (ref 8.9–10.3)
Chloride: 104 mmol/L (ref 101–111)
Chloride: 108 mmol/L (ref 101–111)
Chloride: 106 mmol/L (ref 101–111)
Creatinine, Ser: 2.56 mg/dL — ABNORMAL HIGH (ref 0.61–1.24)
Creatinine, Ser: 2.55 mg/dL — ABNORMAL HIGH (ref 0.61–1.24)
Creatinine, Ser: 2.23 mg/dL — ABNORMAL HIGH (ref 0.61–1.24)
Creatinine, Ser: 1.83 mg/dL — ABNORMAL HIGH (ref 0.61–1.24)
Creatinine, Ser: 1.64 mg/dL — ABNORMAL HIGH (ref 0.61–1.24)
GFR calc non Af Amer: 25 mL/min — ABNORMAL LOW (ref >60)
GFR calc non Af Amer: 25 mL/min — ABNORMAL LOW (ref >60)
GFR calc non Af Amer: 30 mL/min — ABNORMAL LOW (ref >60)
GFR, EST AFRICAN AMERICAN: 29 mL/min — AB (ref >60)
GFR, EST AFRICAN AMERICAN: 29 mL/min — AB (ref >60)
GFR, EST AFRICAN AMERICAN: 34 mL/min — AB (ref >60)
GFR, EST AFRICAN AMERICAN: 44 mL/min — AB (ref >60)
GFR, EST AFRICAN AMERICAN: 50 mL/min — AB (ref >60)
GFR, EST NON AFRICAN AMERICAN: 38 mL/min — AB (ref >60)
GFR, EST NON AFRICAN AMERICAN: 43 mL/min — AB (ref >60)
GLUCOSE: 450 mg/dL — AB (ref 65–99)
GLUCOSE: 277 mg/dL — AB (ref 65–99)
GLUCOSE: 141 mg/dL — AB (ref 65–99)
GLUCOSE: 143 mg/dL — AB (ref 65–99)
Glucose, Bld: 454 mg/dL — ABNORMAL HIGH (ref 65–99)
POTASSIUM: 5 mmol/L (ref 3.5–5.1)
POTASSIUM: 5.1 mmol/L (ref 3.5–5.1)
POTASSIUM: 4.1 mmol/L (ref 3.5–5.1)
POTASSIUM: 4.2 mmol/L (ref 3.5–5.1)
POTASSIUM: 4.3 mmol/L (ref 3.5–5.1)
Sodium: 130 mmol/L — ABNORMAL LOW (ref 135–145)
Sodium: 131 mmol/L — ABNORMAL LOW (ref 135–145)
Sodium: 134 mmol/L — ABNORMAL LOW (ref 135–145)
Sodium: 137 mmol/L (ref 135–145)
Sodium: 135 mmol/L (ref 135–145)

## 2017-04-17 LAB — URINALYSIS, ROUTINE W REFLEX MICROSCOPIC
BILIRUBIN URINE: NEGATIVE
Bacteria, UA: NONE SEEN
Glucose, UA: >=500 mg/dL — AB
Ketones, ur: 20 mg/dL — AB
Leukocytes, UA: NEGATIVE
NITRITE: NEGATIVE
PROTEIN: NEGATIVE mg/dL
Specific Gravity, Urine: 1.017 (ref 1.005–1.030)
Squamous Epithelial / LPF: NONE SEEN
pH: 5 (ref 5.0–8.0)

## 2017-04-17 LAB — GLUCOSE, CAPILLARY
GLUCOSE-CAPILLARY: 167 mg/dL — AB (ref 65–99)
GLUCOSE-CAPILLARY: 149 mg/dL — AB (ref 65–99)
GLUCOSE-CAPILLARY: 198 mg/dL — AB (ref 65–99)
Glucose-Capillary: 105 mg/dL — ABNORMAL HIGH (ref 65–99)
Glucose-Capillary: 105 mg/dL — ABNORMAL HIGH (ref 65–99)
Glucose-Capillary: 123 mg/dL — ABNORMAL HIGH (ref 65–99)
Glucose-Capillary: 228 mg/dL — ABNORMAL HIGH (ref 65–99)

## 2017-04-17 LAB — MRSA PCR SCREENING: MRSA by PCR: NEGATIVE

## 2017-04-17 LAB — CBG MONITORING, ED
GLUCOSE-CAPILLARY: 368 mg/dL — AB (ref 65–99)
GLUCOSE-CAPILLARY: 201 mg/dL — AB (ref 65–99)
GLUCOSE-CAPILLARY: 168 mg/dL — AB (ref 65–99)
GLUCOSE-CAPILLARY: 141 mg/dL — AB (ref 65–99)
Glucose-Capillary: 432 mg/dL — ABNORMAL HIGH (ref 65–99)
Glucose-Capillary: 263 mg/dL — ABNORMAL HIGH (ref 65–99)
Glucose-Capillary: 190 mg/dL — ABNORMAL HIGH (ref 65–99)

## 2017-04-17 LAB — CREATININE, URINE, RANDOM: Creatinine, Urine: 71.96 mg/dL

## 2017-04-17 LAB — TSH: TSH: 1.761 u[IU]/mL (ref 0.350–4.500)

## 2017-04-17 LAB — HIV ANTIBODY (ROUTINE TESTING W REFLEX): HIV Screen 4th Generation wRfx: NONREACTIVE

## 2017-04-17 LAB — VITAMIN B12: Vitamin B-12: 504 pg/mL (ref 180–914)

## 2017-04-17 LAB — SODIUM, URINE, RANDOM: SODIUM UR: 41 mmol/L

## 2017-04-17 MED ORDER — ZOLPIDEM TARTRATE 5 MG PO TABS: 5.0000 mg | ORAL_TABLET | Freq: Every evening | ORAL | Status: DC | PRN

## 2017-04-17 MED ORDER — GABAPENTIN 100 MG PO CAPS: 100.0000 mg | ORAL_CAPSULE | Freq: Three times a day (TID) | ORAL | Status: DC

## 2017-04-17 MED ORDER — SODIUM CHLORIDE 0.9 % IV SOLN
INTRAVENOUS | Status: DC
  Administered 2017-04-17: 125 mL/h via INTRAVENOUS

## 2017-04-17 MED ORDER — INSULIN DETEMIR 100 UNIT/ML ~~LOC~~ SOLN
15.0000 [IU] | Freq: Two times a day (BID) | SUBCUTANEOUS | Status: DC
  Administered 2017-04-18 (×2): 15 [IU] via SUBCUTANEOUS
  Filled 2017-04-17 (×3): qty 0.15

## 2017-04-17 MED ORDER — SODIUM CHLORIDE 0.9 % IV SOLN
INTRAVENOUS | Status: DC
  Filled 2017-04-17: qty 1

## 2017-04-17 MED ORDER — SODIUM CHLORIDE 0.9 % IV SOLN
INTRAVENOUS | Status: DC
  Administered 2017-04-17: 05:00:00 via INTRAVENOUS

## 2017-04-17 MED ORDER — HYDRALAZINE HCL 20 MG/ML IJ SOLN: 5.0000 mg | INTRAMUSCULAR | Status: DC | PRN

## 2017-04-17 MED ORDER — ONDANSETRON HCL 4 MG/2ML IJ SOLN: 4.0000 mg | Freq: Three times a day (TID) | INTRAMUSCULAR | Status: DC | PRN

## 2017-04-17 MED ORDER — INSULIN DETEMIR 100 UNIT/ML ~~LOC~~ SOLN: 15.0000 [IU] | Freq: Two times a day (BID) | SUBCUTANEOUS | Status: DC

## 2017-04-17 MED ORDER — INSULIN ASPART 100 UNIT/ML ~~LOC~~ SOLN
0.0000 [IU] | Freq: Three times a day (TID) | SUBCUTANEOUS | Status: DC
  Administered 2017-04-17 – 2017-04-18 (×3): 5 [IU] via SUBCUTANEOUS

## 2017-04-17 MED ORDER — DEXTROSE-NACL 5-0.45 % IV SOLN: INTRAVENOUS | Status: DC

## 2017-04-17 MED ORDER — SODIUM CHLORIDE 0.9 % IV SOLN: INTRAVENOUS | Status: DC

## 2017-04-17 MED ORDER — DEXTROSE-NACL 5-0.45 % IV SOLN
INTRAVENOUS | Status: DC
  Administered 2017-04-17: 07:00:00 via INTRAVENOUS

## 2017-04-17 MED ORDER — MORPHINE SULFATE (PF) 4 MG/ML IV SOLN: 4.0000 mg | Freq: Once | INTRAVENOUS | Status: DC

## 2017-04-17 MED ORDER — SODIUM CHLORIDE 0.9 % IV BOLUS (SEPSIS)
1000.0000 mL | Freq: Once | INTRAVENOUS | Status: AC
  Administered 2017-04-17: 1000 mL via INTRAVENOUS

## 2017-04-17 MED ORDER — ENOXAPARIN SODIUM 40 MG/0.4ML ~~LOC~~ SOLN
40.0000 mg | SUBCUTANEOUS | Status: DC
  Administered 2017-04-17 – 2017-04-18 (×2): 40 mg via SUBCUTANEOUS
  Filled 2017-04-17 (×3): qty 0.4

## 2017-04-17 MED ORDER — INSULIN DETEMIR 100 UNIT/ML ~~LOC~~ SOLN
15.0000 [IU] | SUBCUTANEOUS | Status: AC
  Administered 2017-04-17: 15 [IU] via SUBCUTANEOUS
  Filled 2017-04-17: qty 0.15

## 2017-04-17 MED ORDER — GABAPENTIN 100 MG PO CAPS
100.0000 mg | ORAL_CAPSULE | Freq: Three times a day (TID) | ORAL | Status: DC
  Administered 2017-04-17 – 2017-04-18 (×4): 100 mg via ORAL
  Filled 2017-04-17 (×4): qty 1

## 2017-04-17 MED ORDER — ACETAMINOPHEN 500 MG PO TABS
500.0000 mg | ORAL_TABLET | Freq: Four times a day (QID) | ORAL | Status: DC | PRN
  Administered 2017-04-17 – 2017-04-18 (×2): 500 mg via ORAL
  Filled 2017-04-17 (×2): qty 1

## 2017-04-17 MED ORDER — INSULIN REGULAR HUMAN 100 UNIT/ML IJ SOLN
INTRAMUSCULAR | Status: DC
  Administered 2017-04-17: 3.7 [IU]/h via INTRAVENOUS
  Filled 2017-04-17: qty 1

## 2017-04-17 MED ORDER — AMLODIPINE BESYLATE 5 MG PO TABS
5.0000 mg | ORAL_TABLET | Freq: Every day | ORAL | Status: DC
  Administered 2017-04-17 – 2017-04-18 (×2): 5 mg via ORAL
  Filled 2017-04-17 (×2): qty 1

## 2017-04-17 NOTE — Social Work (Signed)
CSW met with patient at bedside as RNCM advised that patient homeless and will need transportation assistance on Saturday at dc. Patient is a pleasant gentleman. CSW confirmed that patient is from [employer] and will return at [employer]. He denies the need for additional resources. [place] support [place] is in [place] and he hopes to get there in the future. Pt will need transportation assistance at dc. CSW will advise weekend CSW to assist.

## 2017-04-17 NOTE — Care Management Note (Addendum)
Case Management Note  Patient Details  Name: [name] MRN: [id number] Date of Birth: December 07, 1950  Subjective/Objective:      Admitted with history significant of hypertension, diabetes, CKD-2, homeless, who presents with polyuria, polydipsia and bilateral lower leg pain. Homeless, PTA lived @ the shelter. Pt recently released from jail. Pleasant gentleman with no support or family in the area. PTA independent with ADL's , and no DME usage.  PCP: none, [facility]  Action/Plan: Pt without insurance, no money to afford Rx medication @ d/c. Pt will need Match Letter if d/c over the weekend to assist with medication needs.  CM made CSW aware @ d/c pt will need bus pass for transportation back to shelter. Pt is to call on Monday 04/20/2017 to arrange hospital follow up appointment and establish [facility]. CM made pt aware and provided pt with information.  Expected Discharge Date:                  Expected Discharge Plan:  Home/Self Care  In-House Referral:  Clinical Social Work (homeless)  Discharge planning Services  CM Consult[facility]  Post Acute Care Choice:    Choice offered to:     DME Arranged:    DME Agency:     HH Arranged:    HH Agency:     Status of Service:  In process, will continue to follow  If discussed at Long Length of Stay Meetings, dates discussed:    Additional Comments:  CM consult received: Needs to be able to get insulin and other medications; homeless. Needs PCP. See above note per CM.  Gae GallopCole, Lainie Daubert CallawayHudson, RN 04/17/2017, 12:24 PM

## 2017-04-17 NOTE — Progress Notes (Signed)
04/17/2017 Patient came from the emergency room to 50M at 1118. He is alert, orient and ambulatory. Patient skin no wound, bruises on arms, swelling lower bilateral. Foot swelling and warm to touch and Dr Gwenlyn PerkingMadera was made aware.  [facility]. Received CHG and MRSA swab. [facility] Center[facility] Jye Fariss RN.
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
## 2017-04-17 NOTE — Care Management (Signed)
ED CM met with patient last night concerning medication assistance. Patient was released from prison this week with medications and discharge packet. Patient medications and packet was [place] bus depot, patient unsure of medication or dosages. CM  attempting to get a medication list from [facility] and was informed that patient had copies and that  information was sent to the [facility] in [place].  CM contacted the [facility] at the [facility] who was familiar with patient, and situation. The [facility] was also trying to get his medication list and secure a bed at [facility] but no available bed at the time.  CM updated [facility]. Patient will need a MATCH co-pay wavier for medications if discharged over the weekend.

## 2017-04-17 NOTE — ED Notes (Signed)
CBG - 141 ° °
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
## 2017-04-17 NOTE — H&P (Signed)
History and Physical    OSKER AYOUB ZOX:[id number] DOB: 06/21/1951 DOA: 04/16/2017  Referring MD/NP/PA:   PCP: Patient, No Pcp Per   Patient coming from:  The patient is coming from shelter.  At baseline, pt is independent for most of ADL.  Chief Complaint: Polyuria, polydipsia, bilateral lower leg pain  HPI: [name] is a 66 y.o. male with medical history significant of hypertension, diabetes, CKD-2, homeless, who presents with polyuria, polydipsia and bilateral lower leg pain.  Patient states that he has not been taking his diabetic medications for almost 2 weeks due to difficult to get a refill. He went [facility] to fill his prescriptions, but was told that it wouldn't be for another one or 2 weeks until they were able to fill his prescriptions. He developed polyuria and polydipsia. He also has bilateral lower leg pain, which is tingling type of pain. He was seen in the emergency department 4 days ago for bilateral foot pain. He states that he was given Tylenol and gabapentin for pain, which improved his symptoms, but he now can not get a refill of his medications. Patient does not have chest pain, SOB, cough, fever or chills. No nausea, vomiting, diarrhea or abdominal pain. Denies symptoms of UTI or unilateral weakness.  ED Course: pt was found to have DKA with blood sugar 491, AG 17 and bicarbonate 18. WBC 7.9, negative urinalysis, potassium 5.3, worsening renal function, pseudohyponatremia, temperature normal, blood pressure 150/63, oxygen saturation 97-100% on room air. Patient is admitted to stepdown as inpatient.  Review of Systems:   General: no fevers, chills, no changes in body weight, has fatigue HEENT: no blurry vision, hearing changes or sore throat Respiratory: no dyspnea, coughing, wheezing CV: no chest pain, no palpitations GI: no nausea, vomiting, abdominal pain, diarrhea, constipation GU: no dysuria, burning on urination, increased urinary frequency, hematuria  Ext: no  leg edema. Neuro: no unilateral weakness, numbness, or tingling, no vision change or hearing loss Skin: no rash, no skin tear. MSK: No muscle spasm, no deformity, no limitation of range of movement in spin. Has bilateral lower leg pain.  Heme: No easy bruising.  Travel history: No recent long distant travel.  Allergy: No Known Allergies  Past Medical History:  Diagnosis Date  . CKD (chronic kidney disease), stage II   . Diabetes mellitus   . Essential hypertension     History reviewed. No pertinent surgical history.  Social History:  reports that he has never smoked. He has never used smokeless tobacco. He reports that he does not drink alcohol or use drugs.  Family History: Reviewed with patient, but patient does not know any family medical history.  Prior to Admission medications   Medication Sig Start Date End Date Taking? Authorizing Provider  acetaminophen (TYLENOL) 325 MG tablet Take 2 tablets (650 mg total) by mouth every 6 (six) hours as needed. 04/16/17   Lavera Guise, MD  acetaminophen (TYLENOL) 500 MG tablet Take 1 tablet (500 mg total) by mouth every 6 (six) hours as needed. Patient not taking: Reported on 04/16/2017 04/12/17   Fayrene Helper, PA-C  gabapentin (NEURONTIN) 100 MG capsule Take 1 capsule (100 mg total) by mouth 3 (three) times daily. Patient not taking: Reported on 04/16/2017 04/12/17   Fayrene Helper, PA-C  gabapentin (NEURONTIN) 100 MG capsule Take 1 capsule (100 mg total) by mouth 3 (three) times daily. 04/16/17   Lavera Guise, MD  metFORMIN (GLUCOPHAGE) 500 MG tablet Take 1 tablet (500 mg total)  by mouth 2 (two) times daily with a meal. Patient not taking: Reported on 04/16/2017 10/07/11 10/06/12  Dione BoozeGlick, David, MD  metFORMIN (GLUCOPHAGE) 500 MG tablet Take 1 tablet (500 mg total) by mouth 2 (two) times daily with a meal. 04/16/17   Lavera GuiseLiu, Dana Duo, MD    Physical Exam: Vitals:   04/17/17 0330 04/17/17 0345 04/17/17 0400 04/17/17 0415  BP: 134/65 123/63 134/73  128/69  Pulse: 70 70 73 77  Resp: (!) 21 18 19 16   Temp:      TempSrc:      SpO2: 99% 98% 98% 99%  Weight:      Height:       General: Not in acute distress. Dry mucus and membrane. HEENT:       Eyes: PERRL, EOMI, no scleral icterus.       ENT: No discharge from the ears and nose, no pharynx injection, no tonsillar enlargement.        Neck: No JVD, no bruit, no mass felt. Heme: No neck lymph node enlargement. Cardiac: S1/S2, RRR, No murmurs, No gallops or rubs. Respiratory: No rales, wheezing, rhonchi or rubs. GI: Soft, nondistended, nontender, no rebound pain, no organomegaly, BS present. GU: No hematuria Ext: No pitting leg edema bilaterally. 2+DP/PT pulse bilaterally. Musculoskeletal: No joint deformities, No joint redness or warmth, no limitation of ROM in spin. Skin: No rashes.  Neuro: Alert, oriented X3, cranial nerves II-XII grossly intact, moves all extremities normally.  Psych: Patient is not psychotic, no suicidal or hemocidal ideation.  Labs on Admission: I have personally reviewed following labs and imaging studies  CBC:  Recent Labs Lab 04/16/17 2311  WBC 7.9  NEUTROABS 5.9  HGB 11.6*  HCT 35.5*  MCV 85.5  PLT 322   Basic Metabolic Panel:  Recent Labs Lab 04/16/17 2311 04/17/17 0115 04/17/17 0135  NA 128* 130* 131*  K 5.3* 5.0 5.1  CL 93* 100* 101  CO2 18* 17* 18*  GLUCOSE 491* 454* 450*  BUN 85* 81* 80*  CREATININE 2.81* 2.56* 2.55*  CALCIUM 9.7 8.9 8.9   GFR: Estimated Creatinine Clearance: 37.5 mL/min (A) (by C-G formula based on SCr of 2.55 mg/dL (H)). Liver Function Tests:  Recent Labs Lab 04/16/17 2311  AST 18  ALT 24  ALKPHOS 87  BILITOT 1.0  PROT 8.9*  ALBUMIN 3.7   No results for input(s): LIPASE, AMYLASE in the last 168 hours. No results for input(s): AMMONIA in the last 168 hours. Coagulation Profile: No results for input(s): INR, PROTIME in the last 168 hours. Cardiac Enzymes: No results for input(s): CKTOTAL, CKMB,  CKMBINDEX, TROPONINI in the last 168 hours. BNP (last 3 results) No results for input(s): PROBNP in the last 8760 hours. HbA1C: No results for input(s): HGBA1C in the last 72 hours. CBG:  Recent Labs Lab 04/16/17 2114 04/17/17 0223 04/17/17 0427  GLUCAP 530* 432* 368*   Lipid Profile: No results for input(s): CHOL, HDL, LDLCALC, TRIG, CHOLHDL, LDLDIRECT in the last 72 hours. Thyroid Function Tests: No results for input(s): TSH, T4TOTAL, FREET4, T3FREE, THYROIDAB in the last 72 hours. Anemia Panel: No results for input(s): VITAMINB12, FOLATE, FERRITIN, TIBC, IRON, RETICCTPCT in the last 72 hours. Urine analysis:    Component Value Date/Time   COLORURINE STRAW (A) 04/16/2017 0013   APPEARANCEUR CLEAR 04/16/2017 0013   LABSPEC 1.017 04/16/2017 0013   PHURINE 5.0 04/16/2017 0013   GLUCOSEU >=500 (A) 04/16/2017 0013   HGBUR MODERATE (A) 04/16/2017 0013   BILIRUBINUR NEGATIVE 04/16/2017  0013   KETONESUR 20 (A) 04/16/2017 0013   PROTEINUR NEGATIVE 04/16/2017 0013   UROBILINOGEN 0.2 10/07/2011 0849   NITRITE NEGATIVE 04/16/2017 0013   LEUKOCYTESUR NEGATIVE 04/16/2017 0013   Sepsis Labs: @LABRCNTIP (procalcitonin:4,lacticidven:4) )No results found for this or any previous visit (from the past 240 hour(s)).   Radiological Exams on Admission: No results found.   EKG: Independently reviewed.  Sinus rhythm, QTC 488, no ischemic change.  Assessment/Plan Principal Problem:   DKA (diabetic ketoacidoses) (HCC) Active Problems:   Acute renal failure superimposed on stage 2 chronic kidney disease (HCC)   Hyperkalemia   Leg pain, bilateral   Essential hypertension   Diabetic peripheral neuropathy (HCC)   Homeless   DKA (diabetic ketoacidoses) (HCC):  blood sugar 491, AG 17 and bicarbonate 18. This is due to medication noncompliance. Mental status normal. Hemodynamically stable. No sinus infection.  - Admit to stepdown  - Received 2L of NS bolus in ED - start DKA protocol with  BMP q4h - IVF: NS 150 cc/h; will switch to D5-1/2NS when CBG<250 - replete K as needed - Zofran prn nausea  - NPO   AoCKD-II: Baseline Cre is 1.0, pt's Cre is  2.81 on admission. Likely due to prerenal secondary to dehydration - IVF as above - Check FeNa - Follow up renal function by BMP  Hyperkalemia: K=5.3. Due to DKA -expect correction with IV insulin  Leg pain, bilateral: Most likely due to diabetic peripheral neuropathy  -Neurontin and prn tylenol  Essential hypertension: -start amlodipine 5 mg daily -IV hydralazine when necessary  Homeless -consult to CM and SW   DVT ppx:  SQ Lovenox Code Status: Full code Family Communication: None at bed side.     Disposition Plan:  Anticipate discharge back to shelter Consults called:  none Admission status: Inpatient/tele     Date of Service 04/17/2017    [name] [facility] Pager [phone]  If 7PM-7AM, please contact night-coverage [email or web address] Password [facility] 04/17/2017, 4:47 AM

## 2017-04-18 DIAGNOSIS — E1110 Type 2 diabetes mellitus with ketoacidosis without coma: Principal | ICD-10-CM

## 2017-04-18 DIAGNOSIS — Z590 Homelessness: Secondary | ICD-10-CM

## 2017-04-18 LAB — HEMOGLOBIN A1C
Hgb A1c MFr Bld: 9.1 % — ABNORMAL HIGH (ref 4.8–5.6)
Mean Plasma Glucose: 214 mg/dL

## 2017-04-18 LAB — BASIC METABOLIC PANEL
ANION GAP: 5 (ref 5–15)
BUN: 39 mg/dL — ABNORMAL HIGH (ref 6–20)
CHLORIDE: 106 mmol/L (ref 101–111)
CO2: 23 mmol/L (ref 22–32)
Calcium: 8.6 mg/dL — ABNORMAL LOW (ref 8.9–10.3)
Creatinine, Ser: 1.55 mg/dL — ABNORMAL HIGH (ref 0.61–1.24)
GFR calc non Af Amer: 46 mL/min — ABNORMAL LOW (ref >60)
GFR, EST AFRICAN AMERICAN: 53 mL/min — AB (ref >60)
Glucose, Bld: 265 mg/dL — ABNORMAL HIGH (ref 65–99)
Potassium: 4.3 mmol/L (ref 3.5–5.1)
Sodium: 134 mmol/L — ABNORMAL LOW (ref 135–145)

## 2017-04-18 LAB — GLUCOSE, CAPILLARY
GLUCOSE-CAPILLARY: 202 mg/dL — AB (ref 65–99)
GLUCOSE-CAPILLARY: 250 mg/dL — AB (ref 65–99)

## 2017-04-18 MED ORDER — METFORMIN HCL 500 MG PO TABS: 500.0000 mg | ORAL_TABLET | Freq: Two times a day (BID) | ORAL | 1 refills | Status: AC

## 2017-04-18 MED ORDER — "INSULIN SYRINGE 31G X 5/16"" 0.3 ML MISC": 1 refills | Status: AC

## 2017-04-18 MED ORDER — AMLODIPINE BESYLATE 5 MG PO TABS: 5.0000 mg | ORAL_TABLET | Freq: Every day | ORAL | 1 refills | Status: AC

## 2017-04-18 MED ORDER — BLOOD GLUCOSE METER KIT: PACK | 0 refills | Status: AC

## 2017-04-18 MED ORDER — GABAPENTIN 100 MG PO CAPS: 100.0000 mg | ORAL_CAPSULE | Freq: Three times a day (TID) | ORAL | 1 refills | Status: AC

## 2017-04-18 MED ORDER — INSULIN NPH ISOPHANE & REGULAR (70-30) 100 UNIT/ML ~~LOC~~ SUSP: 20.0000 [IU] | Freq: Two times a day (BID) | SUBCUTANEOUS | 2 refills | Status: AC

## 2017-04-18 NOTE — Progress Notes (Signed)
CSW spoke with pt re: shelter options. [facility] and [place].  Pt agreeable to f/u on his own, and plans on staying with his friend until a shelter bed is available.  Shelter list and bus passes provided.  Emotional support offered.  Pollyann SavoyJody Jeiden Daughtridge, LCSW Weekend Coverage [phone]

## 2017-04-18 NOTE — Care Management Note (Signed)
Case Management Note  Patient Details  Name: [name] MRN: [id number] Date of Birth: Nov 10, 1950  Subjective/Objective:           Patient with order to DC. [facility] Patient verbalized understanding for use. [facility] added to AVS. Patient understands [name] CM there is familiar with his case and he is to call Monday for appointment. Reviewed with patient that [employer] is lowest cost glucose meter and supplies for purchase and use until [facility]. CM unable to provide free meter. For transportation needs should they arise, please call CSW.          Action/Plan:  DC to self care, MATCH letter provided.  Expected Discharge Date:  04/18/17               Expected Discharge Plan:  Home/Self Care  In-House Referral:  Clinical Social Work (homeless)  Discharge planning Services  CM Consult, [facility]  Post Acute Care Choice:    Choice offered to:     DME Arranged:    DME Agency:     HH Arranged:    HH Agency:     Status of Service:  Completed, signed off  If discussed at [employer] Meetings, dates discussed:    Additional Comments:  Lawerance SabalDebbie Nyquan Selbe, RN 04/18/2017, 11:49 AM

## 2017-04-18 NOTE — Progress Notes (Signed)
Pt discharged via wheelchair after discharge instructions and prescriptions given. All questions answered. Pt in stable condition at discharged.
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
## 2017-08-11 LAB — POCT GLYCOSYLATED HEMOGLOBIN (HGB A1C): Hemoglobin A1C: 9.2

## 2017-08-11 LAB — GLUCOSE, POCT (MANUAL RESULT ENTRY): POC GLUCOSE: 220 mg/dL — AB (ref 70–99)

## 2017-09-23 ENCOUNTER — Encounter (HOSPITAL_COMMUNITY): Payer: Self-pay | Admitting: Nurse Practitioner

## 2017-09-23 ENCOUNTER — Other Ambulatory Visit: Payer: Self-pay

## 2017-09-23 ENCOUNTER — Emergency Department (HOSPITAL_COMMUNITY)
Admission: EM | Admit: 2017-09-23 | Discharge: 2017-09-23 | Disposition: A | Discharge status: Home / Self Care | Payer: Self-pay | Attending: Emergency Medicine | Admitting: Emergency Medicine

## 2017-09-23 DIAGNOSIS — E11649 Type 2 diabetes mellitus with hypoglycemia without coma: Secondary | ICD-10-CM | POA: Insufficient documentation

## 2017-09-23 DIAGNOSIS — Z794 Long term (current) use of insulin: Secondary | ICD-10-CM | POA: Insufficient documentation

## 2017-09-23 DIAGNOSIS — E1122 Type 2 diabetes mellitus with diabetic chronic kidney disease: Secondary | ICD-10-CM | POA: Insufficient documentation

## 2017-09-23 DIAGNOSIS — N182 Chronic kidney disease, stage 2 (mild): Secondary | ICD-10-CM | POA: Insufficient documentation

## 2017-09-23 DIAGNOSIS — E162 Hypoglycemia, unspecified: Secondary | ICD-10-CM

## 2017-09-23 DIAGNOSIS — I129 Hypertensive chronic kidney disease with stage 1 through stage 4 chronic kidney disease, or unspecified chronic kidney disease: Secondary | ICD-10-CM | POA: Insufficient documentation

## 2017-09-23 LAB — URINALYSIS, ROUTINE W REFLEX MICROSCOPIC
Bilirubin Urine: NEGATIVE
Glucose, UA: NEGATIVE mg/dL
Hgb urine dipstick: NEGATIVE
KETONES UR: NEGATIVE mg/dL
LEUKOCYTES UA: NEGATIVE
NITRITE: NEGATIVE
PH: 5 (ref 5.0–8.0)
PROTEIN: NEGATIVE mg/dL
Specific Gravity, Urine: 1.009 (ref 1.005–1.030)

## 2017-09-23 LAB — CBC WITH DIFFERENTIAL/PLATELET
BASOS ABS: 0 10*3/uL (ref 0.0–0.1)
BASOS PCT: 0 %
EOS ABS: 0 10*3/uL (ref 0.0–0.7)
EOS PCT: 0 %
HCT: 41.5 % (ref 39.0–52.0)
Hemoglobin: 13.5 g/dL (ref 13.0–17.0)
LYMPHS PCT: 9 %
Lymphs Abs: 0.8 10*3/uL (ref 0.7–4.0)
MCH: 30.1 pg (ref 26.0–34.0)
MCHC: 32.5 g/dL (ref 30.0–36.0)
MCV: 92.4 fL (ref 78.0–100.0)
MONO ABS: 0.2 10*3/uL (ref 0.1–1.0)
Monocytes Relative: 3 %
Neutro Abs: 8.1 10*3/uL — ABNORMAL HIGH (ref 1.7–7.7)
Neutrophils Relative %: 88 %
PLATELETS: 242 10*3/uL (ref 150–400)
RBC: 4.49 MIL/uL (ref 4.22–5.81)
RDW: 12.5 % (ref 11.5–15.5)
WBC: 9.2 10*3/uL (ref 4.0–10.5)

## 2017-09-23 LAB — I-STAT TROPONIN, ED: TROPONIN I, POC: 0 ng/mL (ref 0.00–0.08)

## 2017-09-23 LAB — COMPREHENSIVE METABOLIC PANEL
ALBUMIN: 4.4 g/dL (ref 3.5–5.0)
ALT: 30 U/L (ref 17–63)
AST: 54 U/L — AB (ref 15–41)
Alkaline Phosphatase: 101 U/L (ref 38–126)
Anion gap: 9 (ref 5–15)
BUN: 21 mg/dL — AB (ref 6–20)
CHLORIDE: 105 mmol/L (ref 101–111)
CO2: 25 mmol/L (ref 22–32)
CREATININE: 1.58 mg/dL — AB (ref 0.61–1.24)
Calcium: 8.8 mg/dL — ABNORMAL LOW (ref 8.9–10.3)
GFR calc Af Amer: 52 mL/min — ABNORMAL LOW (ref >60)
GFR, EST NON AFRICAN AMERICAN: 45 mL/min — AB (ref >60)
Glucose, Bld: 124 mg/dL — ABNORMAL HIGH (ref 65–99)
POTASSIUM: 3.7 mmol/L (ref 3.5–5.1)
SODIUM: 139 mmol/L (ref 135–145)
Total Bilirubin: 0.5 mg/dL (ref 0.3–1.2)
Total Protein: 8.7 g/dL — ABNORMAL HIGH (ref 6.5–8.1)

## 2017-09-23 LAB — CBG MONITORING, ED
GLUCOSE-CAPILLARY: 201 mg/dL — AB (ref 65–99)
Glucose-Capillary: 85 mg/dL (ref 65–99)
Glucose-Capillary: 133 mg/dL — ABNORMAL HIGH (ref 65–99)

## 2017-09-23 MED ORDER — SODIUM CHLORIDE 0.9 % IV BOLUS (SEPSIS)
1000.0000 mL | Freq: Once | INTRAVENOUS | Status: AC
  Administered 2017-09-23: 1000 mL via INTRAVENOUS
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
## 2017-09-23 NOTE — ED Provider Notes (Signed)
[facility] DEPT Provider Note   CSN: [id number] Arrival date & time: 09/23/17  1439     History   Chief Complaint Chief Complaint  Patient presents with  . Hypoglycemia    HPI [name] is a 66 y.o. male.  HPI  Reports sugar was 237 then 20U of insulin in the morning which is what he normally takes.  Ate breakfast after taking it, was small breakfast, eggs, sausage.  Didn't have lunch.  Doesn't remember where he was when the called the ambulance.  Remembers he felt tired, lightheaded.  Bystanders caleld because he was sleepy.  EMS came and gave him something to eat with glucose of 51, improved to 68 then went to 89 after oral glucose.  Reports still feels lightheaded but feeling a little better.  Today didn't feel like eating like he was supposed to, was feeling weak.  Hasn't felt sick otherwise, no cough (reports every now and then), no urinary symptoms, no fevers, no sore throat, no chest pain or dyspnea.    Past Medical History:  Diagnosis Date  . CKD (chronic kidney disease), stage II   . Diabetes mellitus   . Essential hypertension     Patient Active Problem List   Diagnosis Date Noted  . DKA (diabetic ketoacidoses) ([place]) 04/17/2017  . Acute renal failure superimposed on stage 2 chronic kidney disease ([place]) 04/17/2017  . Hyperkalemia 04/17/2017  . Leg pain, bilateral 04/17/2017  . Essential hypertension 04/17/2017  . Diabetic peripheral neuropathy ([place]) 04/17/2017  . Homeless 04/17/2017    History reviewed. No pertinent surgical history.     Home Medications    Prior to Admission medications   Medication Sig Start Date End Date Taking? Authorizing Provider  acetaminophen (TYLENOL) 500 MG tablet Take 1 tablet (500 mg total) by mouth every 6 (six) hours as needed. Patient not taking: Reported on 04/16/2017 04/12/17   Domenic Moras, PA-C  amLODipine (NORVASC) 5 MG tablet Take 1 tablet (5 mg total) by mouth daily. 04/19/17   Barton Dubois, MD  blood glucose meter kit and supplies Dispense based on patient and insurance preference. Use up to four times daily as directed. (FOR ICD-9 250.00, 250.01). 04/18/17   Barton Dubois, MD  gabapentin (NEURONTIN) 100 MG capsule Take 1 capsule (100 mg total) by mouth 3 (three) times daily. 04/18/17   Barton Dubois, MD  insulin NPH-regular Human (NOVOLIN 70/30) (70-30) 100 UNIT/ML injection Inject 20 Units into the skin 2 (two) times daily with a meal. 04/18/17   Barton Dubois, MD  Insulin Syringe-Needle U-100 (INSULIN SYRINGE .3CC/31GX5/16") 31G X 5/16" 0.3 ML MISC Use to inject insulin as instructed twice as day (please use clean syringes with each injection) 04/18/17   Barton Dubois, MD  metFORMIN (GLUCOPHAGE) 500 MG tablet Take 1 tablet (500 mg total) by mouth 2 (two) times daily with a meal. Patient not taking: Reported on 04/16/2017 97/02/63 78/58/85  Delora Fuel, MD  metFORMIN (GLUCOPHAGE) 500 MG tablet Take 1 tablet (500 mg total) by mouth 2 (two) times daily with a meal. 04/18/17 04/18/18  Barton Dubois, MD    Family History History reviewed. No pertinent family history.  Social History Social History   Tobacco Use  . Smoking status: Never Smoker  . Smokeless tobacco: Never Used  Substance Use Topics  . Alcohol use: No    Comment: heavy  . Drug use: No     Allergies   Patient has no known allergies.   Review of  Systems Review of Systems  Constitutional: Positive for fatigue. Negative for fever.  HENT: Negative for sore throat.   Eyes: Negative for visual disturbance.  Respiratory: Negative for cough and shortness of breath.   Cardiovascular: Negative for chest pain.  Gastrointestinal: Negative for abdominal pain, diarrhea, nausea and vomiting.  Genitourinary: Negative for difficulty urinating and dysuria.  Musculoskeletal: Negative for back pain and neck stiffness.  Skin: Negative for rash.  Neurological: Positive for light-headedness. Negative for syncope and  headaches.     Physical Exam Updated Vital Signs BP 133/78 (BP Location: Right Arm)   Pulse 75   Temp 98.4 F (36.9 C) (Oral)   Resp 20   Ht 6' 1" (1.854 m)   Wt 91.6 kg (202 lb)   SpO2 100%   BMI 26.65 kg/m   Physical Exam  Constitutional: He is oriented to person, place, and time. He appears well-developed and well-nourished. No distress.  HENT:  Head: Normocephalic and atraumatic.  Eyes: Conjunctivae and EOM are normal.  Neck: Normal range of motion.  Cardiovascular: Normal rate, regular rhythm, normal heart sounds and intact distal pulses. Exam reveals no gallop and no friction rub.  No murmur heard. Pulmonary/Chest: Effort normal and breath sounds normal. No respiratory distress. He has no wheezes. He has no rales.  Abdominal: Soft. He exhibits no distension. There is no tenderness. There is no guarding.  Musculoskeletal: He exhibits no edema.  Neurological: He is alert and oriented to person, place, and time.  Skin: Skin is warm and dry. He is not diaphoretic.  Nursing note and vitals reviewed.    ED Treatments / Results  Labs (all labs ordered are listed, but only abnormal results are displayed) Labs Reviewed  CBC WITH DIFFERENTIAL/PLATELET  COMPREHENSIVE METABOLIC PANEL  URINALYSIS, ROUTINE W REFLEX MICROSCOPIC  CBG MONITORING, ED  I-STAT TROPONIN, ED    EKG  EKG Interpretation None       Radiology No results found.  Procedures Procedures (including critical care time)  Medications Ordered in ED Medications  sodium chloride 0.9 % bolus 1,000 mL (not administered)     Initial Impression / Assessment and Plan / ED Course  I have reviewed the triage vital signs and the nursing notes.  Pertinent labs & imaging results that were available during my care of the patient were reviewed by me and considered in my medical decision making (see chart for details).    66yo male with history of htn, CKD, DM presents with concern for fatigue and  hypoglycemia. Patient had not yet had lunch yet after taking morning insulin, ans suspect this was etiology of hypoglycemia which was etiology of his somnolence for which bystanders called EMS. He has improved following oral glucose. Given he reports some fatigue and lightheadedness and it is not clear if this occurred as a cause for him not wanting to eat, or was the result of not eating so will obtain other screening labs, urinalysis.  CBC shows no significant anemia.  CMP shows labs near baseline. Patient tolerated po in the ED. His glucose remains stable.    Suspect symptoms related to hypoglycemia from patient not eating lunch today.  Recommend regular eating schedule, PCP folow up. Patient discharged in stable condition with understanding of reasons to return.   Final Clinical Impressions(s) / ED Diagnoses   Final diagnoses:  Hypoglycemia    ED Discharge Orders    None       Gareth Morgan, MD 09/24/17 [phone]

## 2017-09-23 NOTE — ED Notes (Signed)
CBG 133 and patient sitting upright in bed eating [employer]. Denies any n/v or abd pain at this time.

## 2021-10-20 DEATH — deceased
